# Patient Record
Sex: Female | Born: 1975 | Race: Black or African American | Hispanic: No | Marital: Single | Smoking: Former smoker
Health system: Southern US, Community
[De-identification: ages and names within clinical notes are randomized; demographics above are authoritative.]

## PROBLEM LIST (undated history)

## (undated) DIAGNOSIS — N946 Dysmenorrhea, unspecified: Secondary | ICD-10-CM

## (undated) DIAGNOSIS — IMO0002 Reserved for concepts with insufficient information to code with codable children: Secondary | ICD-10-CM

## (undated) DIAGNOSIS — F418 Other specified anxiety disorders: Secondary | ICD-10-CM

## (undated) DIAGNOSIS — D649 Anemia, unspecified: Secondary | ICD-10-CM

## (undated) DIAGNOSIS — G43009 Migraine without aura, not intractable, without status migrainosus: Secondary | ICD-10-CM

## (undated) DIAGNOSIS — D500 Iron deficiency anemia secondary to blood loss (chronic): Secondary | ICD-10-CM

## (undated) DIAGNOSIS — N920 Excessive and frequent menstruation with regular cycle: Secondary | ICD-10-CM

## (undated) HISTORY — DX: Dysmenorrhea, unspecified: N94.6

## (undated) HISTORY — DX: Other specified anxiety disorders: F41.8

## (undated) HISTORY — DX: Excessive and frequent menstruation with regular cycle: N92.0

## (undated) HISTORY — DX: Iron deficiency anemia secondary to blood loss (chronic): D50.0

## (undated) HISTORY — DX: Migraine without aura, not intractable, without status migrainosus: G43.009

## (undated) HISTORY — DX: Reserved for concepts with insufficient information to code with codable children: IMO0002

---

## 2014-10-29 DIAGNOSIS — Z01419 Encounter for gynecological examination (general) (routine) without abnormal findings: Secondary | ICD-10-CM | POA: Diagnosis not present

## 2014-11-01 DIAGNOSIS — Z0000 Encounter for general adult medical examination without abnormal findings: Secondary | ICD-10-CM | POA: Diagnosis not present

## 2014-11-05 DIAGNOSIS — N852 Hypertrophy of uterus: Secondary | ICD-10-CM | POA: Diagnosis not present

## 2014-11-05 DIAGNOSIS — N830 Follicular cyst of ovary: Secondary | ICD-10-CM | POA: Diagnosis not present

## 2014-11-05 DIAGNOSIS — R102 Pelvic and perineal pain: Secondary | ICD-10-CM | POA: Diagnosis not present

## 2014-11-26 DIAGNOSIS — Z7189 Other specified counseling: Secondary | ICD-10-CM | POA: Diagnosis not present

## 2015-01-14 DIAGNOSIS — L03114 Cellulitis of left upper limb: Secondary | ICD-10-CM | POA: Diagnosis not present

## 2015-01-14 DIAGNOSIS — S50812A Abrasion of left forearm, initial encounter: Secondary | ICD-10-CM | POA: Diagnosis not present

## 2015-03-05 DIAGNOSIS — L853 Xerosis cutis: Secondary | ICD-10-CM | POA: Diagnosis not present

## 2015-03-05 DIAGNOSIS — L700 Acne vulgaris: Secondary | ICD-10-CM | POA: Diagnosis not present

## 2015-03-05 DIAGNOSIS — R202 Paresthesia of skin: Secondary | ICD-10-CM | POA: Diagnosis not present

## 2015-03-05 DIAGNOSIS — L819 Disorder of pigmentation, unspecified: Secondary | ICD-10-CM | POA: Diagnosis not present

## 2015-04-10 DIAGNOSIS — Z0000 Encounter for general adult medical examination without abnormal findings: Secondary | ICD-10-CM | POA: Diagnosis not present

## 2015-04-10 DIAGNOSIS — G43709 Chronic migraine without aura, not intractable, without status migrainosus: Secondary | ICD-10-CM | POA: Diagnosis not present

## 2015-04-10 DIAGNOSIS — D649 Anemia, unspecified: Secondary | ICD-10-CM | POA: Diagnosis not present

## 2015-05-13 DIAGNOSIS — L981 Factitial dermatitis: Secondary | ICD-10-CM | POA: Diagnosis not present

## 2015-05-13 DIAGNOSIS — R202 Paresthesia of skin: Secondary | ICD-10-CM | POA: Diagnosis not present

## 2015-05-13 DIAGNOSIS — L723 Sebaceous cyst: Secondary | ICD-10-CM | POA: Diagnosis not present

## 2015-05-13 DIAGNOSIS — L819 Disorder of pigmentation, unspecified: Secondary | ICD-10-CM | POA: Diagnosis not present

## 2015-05-13 DIAGNOSIS — L700 Acne vulgaris: Secondary | ICD-10-CM | POA: Diagnosis not present

## 2015-06-19 DIAGNOSIS — Z23 Encounter for immunization: Secondary | ICD-10-CM | POA: Diagnosis not present

## 2015-06-20 DIAGNOSIS — L818 Other specified disorders of pigmentation: Secondary | ICD-10-CM | POA: Diagnosis not present

## 2015-06-20 DIAGNOSIS — L723 Sebaceous cyst: Secondary | ICD-10-CM | POA: Diagnosis not present

## 2015-06-20 DIAGNOSIS — L981 Factitial dermatitis: Secondary | ICD-10-CM | POA: Diagnosis not present

## 2015-06-20 DIAGNOSIS — R202 Paresthesia of skin: Secondary | ICD-10-CM | POA: Diagnosis not present

## 2015-06-20 DIAGNOSIS — L700 Acne vulgaris: Secondary | ICD-10-CM | POA: Diagnosis not present

## 2015-08-16 DIAGNOSIS — R531 Weakness: Secondary | ICD-10-CM | POA: Diagnosis not present

## 2015-08-16 DIAGNOSIS — R42 Dizziness and giddiness: Secondary | ICD-10-CM | POA: Diagnosis not present

## 2015-08-16 DIAGNOSIS — R21 Rash and other nonspecific skin eruption: Secondary | ICD-10-CM | POA: Diagnosis not present

## 2015-08-16 DIAGNOSIS — Z1389 Encounter for screening for other disorder: Secondary | ICD-10-CM | POA: Diagnosis not present

## 2015-08-26 DIAGNOSIS — F329 Major depressive disorder, single episode, unspecified: Secondary | ICD-10-CM | POA: Diagnosis not present

## 2015-08-28 DIAGNOSIS — Z0000 Encounter for general adult medical examination without abnormal findings: Secondary | ICD-10-CM | POA: Diagnosis not present

## 2015-08-28 DIAGNOSIS — R42 Dizziness and giddiness: Secondary | ICD-10-CM | POA: Diagnosis not present

## 2015-08-28 DIAGNOSIS — G43709 Chronic migraine without aura, not intractable, without status migrainosus: Secondary | ICD-10-CM | POA: Diagnosis not present

## 2015-08-28 DIAGNOSIS — D649 Anemia, unspecified: Secondary | ICD-10-CM | POA: Diagnosis not present

## 2015-08-29 DIAGNOSIS — D649 Anemia, unspecified: Secondary | ICD-10-CM | POA: Diagnosis not present

## 2015-08-29 DIAGNOSIS — F329 Major depressive disorder, single episode, unspecified: Secondary | ICD-10-CM | POA: Diagnosis not present

## 2015-08-29 DIAGNOSIS — Z0000 Encounter for general adult medical examination without abnormal findings: Secondary | ICD-10-CM | POA: Diagnosis not present

## 2015-08-29 DIAGNOSIS — R21 Rash and other nonspecific skin eruption: Secondary | ICD-10-CM | POA: Diagnosis not present

## 2015-08-30 DIAGNOSIS — F329 Major depressive disorder, single episode, unspecified: Secondary | ICD-10-CM | POA: Diagnosis not present

## 2015-08-30 DIAGNOSIS — R42 Dizziness and giddiness: Secondary | ICD-10-CM | POA: Diagnosis not present

## 2015-09-02 DIAGNOSIS — N946 Dysmenorrhea, unspecified: Secondary | ICD-10-CM | POA: Diagnosis not present

## 2015-09-02 DIAGNOSIS — N920 Excessive and frequent menstruation with regular cycle: Secondary | ICD-10-CM | POA: Diagnosis not present

## 2015-09-02 DIAGNOSIS — D649 Anemia, unspecified: Secondary | ICD-10-CM | POA: Diagnosis not present

## 2015-10-01 DIAGNOSIS — L700 Acne vulgaris: Secondary | ICD-10-CM | POA: Diagnosis not present

## 2015-10-01 DIAGNOSIS — N920 Excessive and frequent menstruation with regular cycle: Secondary | ICD-10-CM | POA: Diagnosis not present

## 2015-10-01 DIAGNOSIS — D509 Iron deficiency anemia, unspecified: Secondary | ICD-10-CM | POA: Diagnosis not present

## 2015-10-18 DIAGNOSIS — F329 Major depressive disorder, single episode, unspecified: Secondary | ICD-10-CM | POA: Diagnosis not present

## 2015-11-05 DIAGNOSIS — F3131 Bipolar disorder, current episode depressed, mild: Secondary | ICD-10-CM | POA: Diagnosis not present

## 2015-12-02 DIAGNOSIS — L700 Acne vulgaris: Secondary | ICD-10-CM | POA: Diagnosis not present

## 2015-12-02 DIAGNOSIS — D509 Iron deficiency anemia, unspecified: Secondary | ICD-10-CM | POA: Diagnosis not present

## 2015-12-02 DIAGNOSIS — F419 Anxiety disorder, unspecified: Secondary | ICD-10-CM | POA: Diagnosis not present

## 2015-12-02 DIAGNOSIS — F410 Panic disorder [episodic paroxysmal anxiety] without agoraphobia: Secondary | ICD-10-CM | POA: Diagnosis not present

## 2015-12-04 DIAGNOSIS — F419 Anxiety disorder, unspecified: Secondary | ICD-10-CM | POA: Diagnosis not present

## 2015-12-05 DIAGNOSIS — D509 Iron deficiency anemia, unspecified: Secondary | ICD-10-CM | POA: Diagnosis not present

## 2015-12-06 DIAGNOSIS — D509 Iron deficiency anemia, unspecified: Secondary | ICD-10-CM | POA: Diagnosis not present

## 2015-12-06 DIAGNOSIS — Z0271 Encounter for disability determination: Secondary | ICD-10-CM | POA: Diagnosis not present

## 2015-12-06 DIAGNOSIS — D649 Anemia, unspecified: Secondary | ICD-10-CM | POA: Diagnosis not present

## 2015-12-09 DIAGNOSIS — D509 Iron deficiency anemia, unspecified: Secondary | ICD-10-CM | POA: Diagnosis not present

## 2015-12-16 DIAGNOSIS — D509 Iron deficiency anemia, unspecified: Secondary | ICD-10-CM | POA: Diagnosis not present

## 2015-12-18 DIAGNOSIS — D509 Iron deficiency anemia, unspecified: Secondary | ICD-10-CM | POA: Diagnosis not present

## 2015-12-23 DIAGNOSIS — D509 Iron deficiency anemia, unspecified: Secondary | ICD-10-CM | POA: Diagnosis not present

## 2015-12-25 DIAGNOSIS — D509 Iron deficiency anemia, unspecified: Secondary | ICD-10-CM | POA: Diagnosis not present

## 2015-12-27 DIAGNOSIS — D509 Iron deficiency anemia, unspecified: Secondary | ICD-10-CM | POA: Diagnosis not present

## 2015-12-30 DIAGNOSIS — D509 Iron deficiency anemia, unspecified: Secondary | ICD-10-CM | POA: Diagnosis not present

## 2016-01-01 DIAGNOSIS — D509 Iron deficiency anemia, unspecified: Secondary | ICD-10-CM | POA: Diagnosis not present

## 2016-01-02 DIAGNOSIS — F3131 Bipolar disorder, current episode depressed, mild: Secondary | ICD-10-CM | POA: Diagnosis not present

## 2016-01-03 DIAGNOSIS — Z566 Other physical and mental strain related to work: Secondary | ICD-10-CM | POA: Diagnosis not present

## 2016-01-03 DIAGNOSIS — D509 Iron deficiency anemia, unspecified: Secondary | ICD-10-CM | POA: Diagnosis not present

## 2016-01-29 DIAGNOSIS — Z862 Personal history of diseases of the blood and blood-forming organs and certain disorders involving the immune mechanism: Secondary | ICD-10-CM | POA: Diagnosis not present

## 2016-02-21 DIAGNOSIS — F3131 Bipolar disorder, current episode depressed, mild: Secondary | ICD-10-CM | POA: Diagnosis not present

## 2016-02-27 DIAGNOSIS — Z8739 Personal history of other diseases of the musculoskeletal system and connective tissue: Secondary | ICD-10-CM | POA: Diagnosis not present

## 2016-03-26 DIAGNOSIS — F3131 Bipolar disorder, current episode depressed, mild: Secondary | ICD-10-CM | POA: Diagnosis not present

## 2016-07-07 DIAGNOSIS — Z23 Encounter for immunization: Secondary | ICD-10-CM | POA: Diagnosis not present

## 2016-09-07 ENCOUNTER — Ambulatory Visit (INDEPENDENT_AMBULATORY_CARE_PROVIDER_SITE_OTHER): Payer: BLUE CROSS/BLUE SHIELD | Admitting: Family Medicine

## 2016-09-07 VITALS — BP 112/70 | HR 71 | Temp 98.4°F | Resp 17 | Ht 68.0 in | Wt 173.0 lb

## 2016-09-07 DIAGNOSIS — D500 Iron deficiency anemia secondary to blood loss (chronic): Secondary | ICD-10-CM | POA: Diagnosis not present

## 2016-09-07 DIAGNOSIS — R55 Syncope and collapse: Secondary | ICD-10-CM | POA: Diagnosis not present

## 2016-09-07 DIAGNOSIS — N920 Excessive and frequent menstruation with regular cycle: Secondary | ICD-10-CM

## 2016-09-07 DIAGNOSIS — L910 Hypertrophic scar: Secondary | ICD-10-CM

## 2016-09-07 DIAGNOSIS — R42 Dizziness and giddiness: Secondary | ICD-10-CM

## 2016-09-07 LAB — POCT URINALYSIS DIP (MANUAL ENTRY)
BILIRUBIN UA: NEGATIVE
Bilirubin, UA: NEGATIVE
GLUCOSE UA: NEGATIVE
Nitrite, UA: POSITIVE — AB
Protein Ur, POC: NEGATIVE
RBC UA: NEGATIVE
SPEC GRAV UA: 1.02
UROBILINOGEN UA: 1
pH, UA: 7

## 2016-09-07 LAB — POCT CBC
GRANULOCYTE PERCENT: 48 % (ref 37–80)
HCT, POC: 28.1 % — AB (ref 37.7–47.9)
Hemoglobin: 9.3 g/dL — AB (ref 12.2–16.2)
Lymph, poc: 2.1 (ref 0.6–3.4)
MCH: 22.7 pg — AB (ref 27–31.2)
MCHC: 33.1 g/dL (ref 31.8–35.4)
MCV: 68.6 fL — AB (ref 80–97)
MID (CBC): 0.5 (ref 0–0.9)
MPV: 7.9 fL (ref 0–99.8)
PLATELET COUNT, POC: 309 10*3/uL (ref 142–424)
POC GRANULOCYTE: 2.4 (ref 2–6.9)
POC LYMPH %: 42.8 % (ref 10–50)
POC MID %: 9.2 %M (ref 0–12)
RBC: 4.1 M/uL (ref 4.04–5.48)
RDW, POC: 16.9 %
WBC: 5 10*3/uL (ref 4.6–10.2)

## 2016-09-07 MED ORDER — FERROUS SULFATE DRIED ER 160 (50 FE) MG PO TBCR: 1.0000 | EXTENDED_RELEASE_TABLET | Freq: Two times a day (BID) | ORAL | 0 refills | Status: DC

## 2016-09-07 MED ORDER — CIPROFLOXACIN HCL 250 MG PO TABS: 250.0000 mg | ORAL_TABLET | Freq: Two times a day (BID) | ORAL | 0 refills | Status: DC

## 2016-09-07 NOTE — Patient Instructions (Addendum)
IF you received an x-ray today, you will receive an invoice from [facility]. Please contact [facility] at [phone] with questions or concerns regarding your invoice.   IF you received labwork today, you will receive an invoice from [employer]. Please contact [facility] at [phone] with questions or concerns regarding your invoice.   Our billing staff will not be able to assist you with questions regarding bills from these companies.  You will be contacted with the lab results as soon as they are available. The fastest way to get your results is to activate your My Chart account. Instructions are located on the last page of this paperwork. If you have not heard from [place] regarding the results in 2 weeks, please contact this office.      Iron Deficiency Anemia, Adult Iron-deficiency anemia is when you have a low amount of red blood cells or hemoglobin. This happens because you have too little iron in your body. Hemoglobin carries oxygen to parts of the body. Anemia can cause your body to not get enough oxygen. It may or may not cause symptoms. Follow these instructions at home: Medicines  Take over-the-counter and prescription medicines only as told by your doctor. This includes iron pills (supplements) and vitamins.  If you cannot handle taking iron pills by mouth, ask your doctor about getting iron through:  A vein (intravenously).  A shot (injection) into a muscle.  Take iron pills when your stomach is empty. If you cannot handle this, take them with food.  Do not drink milk or take antacids at the same time as your iron pills.  To prevent trouble pooping (constipation), eat fiber or take medicine (stool softener) as told by your doctor. Eating and drinking  Talk with your doctor before changing the foods you eat. He or she may tell you to eat foods that have a lot of iron, such as:  Liver.  Lowfat (lean) beef.  Breads  and cereals that have iron added to them (fortified breads and cereals).  Eggs.  Dried fruit.  Dark green, leafy vegetables.  Drink enough fluid to keep your pee (urine) clear or pale yellow.  Eat fresh fruits and vegetables that are high in vitamin C. They help your body to use iron. Foods with a lot of vitamin C include:  Oranges.  Peppers.  Tomatoes.  Mangoes. General instructions  Return to your normal activities as told by your doctor. Ask your doctor what activities are safe for you.  Keep yourself clean, and keep things clean around you (your surroundings). Anemia can make you get sick more easily.  Keep all follow-up visits as told by your doctor. This is important. Contact a doctor if:  You feel sick to your stomach (nauseous).  You throw up (vomit).  You feel weak.  You are sweating for no clear reason.  You have trouble pooping, such as:  Pooping (having a bowel movement) less than 3 times a week.  Straining to poop.  Having poop that is hard, dry, or larger than normal.  Feeling full or bloated.  Pain in the lower belly.  Not feeling better after pooping. Get help right away if:  You pass out (faint). If this happens, do not drive yourself to the hospital. Call your local emergency services (911 in the [place]  You have chest pain.  You have shortness of breath that:  Is very bad.  Gets worse with physical activity.  You have a fast  heartbeat.  You get light-headed when getting up from sitting or lying down. This information is not intended to replace advice given to you by your health care provider. Make sure you discuss any questions you have with your health care provider. Document Released: 10/17/2010 Document Revised: 06/03/2016 Document Reviewed: 06/03/2016 Elsevier Interactive Patient Education  2017 Elsevier Inc.  Menorrhagia Menorrhagia is the medical term for when your menstrual periods are heavy or last longer than usual.  With menorrhagia, every period you have may cause enough blood loss and cramping that you are unable to maintain your usual activities. CAUSES  In some cases, the cause of heavy periods is unknown, but a number of conditions may cause menorrhagia. Common causes include:  A problem with the hormone-producing thyroid gland (hypothyroid).  Noncancerous growths in the uterus (polyps or fibroids).  An imbalance of the estrogen and progesterone hormones.  One of your ovaries not releasing an egg during one or more months.  Side effects of having an intrauterine device (IUD).  Side effects of some medicines, such as anti-inflammatory medicines or blood thinners.  A bleeding disorder that stops your blood from clotting normally. SIGNS AND SYMPTOMS  During a normal period, bleeding lasts between 4 and 8 days. Signs that your periods are too heavy include:  You routinely have to change your pad or tampon every 1 or 2 hours because it is completely soaked.  You pass blood clots larger than 1 inch (2.5 cm) in size.  You have bleeding for more than 7 days.  You need to use pads and tampons at the same time because of heavy bleeding.  You need to wake up to change your pads or tampons during the night.  You have symptoms of anemia, such as tiredness, fatigue, or shortness of breath. DIAGNOSIS  Your health care provider will perform a physical exam and ask you questions about your symptoms and menstrual history. Other tests may be ordered based on what the health care provider finds during the exam. These tests can include:  Blood tests. Blood tests are used to check if you are pregnant or have hormonal changes, a bleeding or thyroid disorder, low iron levels (anemia), or other problems.  Endometrial biopsy. Your health care provider takes a sample of tissue from the inside of your uterus to be examined under a microscope.  Pelvic ultrasound. This test uses sound waves to make a picture of  your uterus, ovaries, and vagina. The pictures can show if you have fibroids or other growths.  Hysteroscopy. For this test, your health care provider will use a small telescope to look inside your uterus. Based on the results of your initial tests, your health care provider may recommend further testing. TREATMENT  Treatment may not be needed. If it is needed, your health care provider may recommend treatment with one or more medicines first. If these do not reduce bleeding enough, a surgical treatment might be an option. The best treatment for you will depend on:   Whether you need to prevent pregnancy.  Your desire to have children in the future.  The cause and severity of your bleeding.  Your opinion and personal preference.  Medicines for menorrhagia may include:  Birth control methods that use hormones. These include the pill, skin patch, vaginal ring, shots that you get every 3 months, hormonal IUD, and implant. These treatments reduce bleeding during your menstrual period.  Medicines that thicken blood and slow bleeding.  Medicines that reduce swelling, such as ibuprofen.  Medicines that contain a synthetic hormone called progestin.   Medicines that make the ovaries stop working for a short time.  You may need surgical treatment for menorrhagia if the medicines are unsuccessful. Treatment options include:  Dilation and curettage (D&C). In this procedure, your health care provider opens (dilates) your cervix and then scrapes or suctions tissue from the lining of your uterus to reduce menstrual bleeding.  Operative hysteroscopy. This procedure uses a tiny tube with a light (hysteroscope) to view your uterine cavity and can help in the surgical removal of a polyp that may be causing heavy periods.  Endometrial ablation. Through various techniques, your health care provider permanently destroys the entire lining of your uterus (endometrium). After endometrial ablation, most  women have little or no menstrual flow. Endometrial ablation reduces your ability to become pregnant.  Endometrial resection. This surgical procedure uses an electrosurgical wire loop to remove the lining of the uterus. This procedure also reduces your ability to become pregnant.  Hysterectomy. Surgical removal of the uterus and cervix is a permanent procedure that stops menstrual periods. Pregnancy is not possible after a hysterectomy. This procedure requires anesthesia and hospitalization. HOME CARE INSTRUCTIONS   Only take over-the-counter or prescription medicines as directed by your health care provider. Take prescribed medicines exactly as directed. Do not change or switch medicines without consulting your health care provider.  Take any prescribed iron pills exactly as directed by your health care provider. Long-term heavy bleeding may result in low iron levels. Iron pills help replace the iron your body lost from heavy bleeding. Iron may cause constipation. If this becomes a problem, increase the bran, fruits, and roughage in your diet.  Do not take aspirin or medicines that contain aspirin 1 week before or during your menstrual period. Aspirin may make the bleeding worse.  If you need to change your sanitary pad or tampon more than once every 2 hours, stay in bed and rest as much as possible until the bleeding stops.  Eat well-balanced meals. Eat foods high in iron. Examples are leafy green vegetables, meat, liver, eggs, and whole grain breads and cereals. Do not try to lose weight until the abnormal bleeding has stopped and your blood iron level is back to normal. SEEK MEDICAL CARE IF:   You soak through a pad or tampon every 1 or 2 hours, and this happens every time you have a period.  You need to use pads and tampons at the same time because you are bleeding so much.  You need to change your pad or tampon during the night.  You have a period that lasts for more than 8 days.  You  pass clots bigger than 1 inch wide.  You have irregular periods that happen more or less often than once a month.  You feel dizzy or faint.  You feel very weak or tired.  You feel short of breath or feel your heart is beating too fast when you exercise.  You have nausea and vomiting or diarrhea while you are taking your medicine.  You have any problems that may be related to the medicine you are taking. SEEK IMMEDIATE MEDICAL CARE IF:   You soak through 4 or more pads or tampons in 2 hours.  You have any bleeding while you are pregnant. MAKE SURE YOU:   Understand these instructions.  Will watch your condition.  Will get help right away if you are not doing well or get worse. This information is not intended  to replace advice given to you by your health care provider. Make sure you discuss any questions you have with your health care provider. Document Released: 09/14/2005 Document Revised: 01/06/2016 Document Reviewed: 03/05/2013 Elsevier Interactive Patient Education  2017 [employer]

## 2016-09-07 NOTE — Progress Notes (Signed)
Chief Complaint  Patient presents with  . Dizziness    Since dec 2nd    HPI   Dizziness and Near- Syncope On December 2nd the patient started  Pt reports that she has been having dizziness and near-syncope.  She reports that she had to lay down at the job.  She denies nausea, vomiting or diarrhea She went occupation medication [place] She reports that she had a normal blood pressure She return to work after eating and drinking water She returned to her station and started feeling dizzy She stopped her iron supplement June 2017  Menorrhagia She reports that she has very heavy periods Patient's last menstrual period was 08/25/2016. Her periods last 5 - 7 days.  She reports that the first 3 days she has very heavy menses with cramp and passing clots.  She states that she is changing a pad every hour.  Dermatology- skin lesion She reports that she was [place] in [place]  She states that she had to get steroid injections in her face She reports that the lesions burn and itch She states that she has developed a new lesion in the past she gets large scarring  Initially the lesion starts like a pimple and seems to not heal The pimple initially drains blood and crusts over   No past medical history on file.  Current Outpatient Prescriptions  Medication Sig Dispense Refill  . ferrous sulfate (SLOW IRON) 160 (50 Fe) MG TBCR SR tablet Take 1 tablet (160 mg total) by mouth 2 (two) times daily. 30 each 0   No current facility-administered medications for this visit.     Allergies:  Allergies  Allergen Reactions  . Ciprofloxacin   . Fish Allergy   . Peanut-Containing Drug Products   . Strawberry (Diagnostic)   . Tylenol [Acetaminophen]     No past surgical history on file.  Social History   Social History  . Marital status: Single    Spouse name: N/A  . Number of children: N/A  . Years of education: N/A   Social History Main Topics  . Smoking status:  Current Some Day Smoker  . Smokeless tobacco: None  . Alcohol use None  . Drug use: Unknown  . Sexual activity: Not Asked   Other Topics Concern  . None   Social History Narrative  . None    Review of Systems  Constitutional: Negative for chills, fever and weight loss.  Eyes: Negative for blurred vision and double vision.  Cardiovascular: Negative for chest pain.  Gastrointestinal: Negative for nausea and vomiting.  Skin: Negative for itching and rash.  Neurological: Positive for dizziness. Negative for tingling and headaches.  Psychiatric/Behavioral: Positive for memory loss. The patient is nervous/anxious and has insomnia.     Objective: Vitals:   09/07/16 1622  BP: 112/70  Pulse: 71  Resp: 17  Temp: 98.4 F (36.9 C)  TempSrc: Oral  SpO2: 100%  Weight: 173 lb (78.5 kg)  Height: 5\' 8"  (1.727 m)  Body mass index is 26.3 kg/m.   Physical Exam  Constitutional: She is oriented to person, place, and time. She appears well-developed and well-nourished.  HENT:  Head: Normocephalic and atraumatic.  Right Ear: External ear normal.  Left Ear: External ear normal.  Nose: Nose normal.  Mouth/Throat: Oropharynx is clear and moist.  Eyes: Conjunctivae, EOM and lids are normal.  Neck: Normal range of motion. Neck supple.  Cardiovascular: Normal rate, regular rhythm, normal heart sounds and intact distal pulses.  No murmur heard. Pulmonary/Chest: Effort normal and breath sounds normal. No respiratory distress. She has no wheezes.  Musculoskeletal: Normal range of motion. She exhibits no edema.  Neurological: She is alert and oriented to person, place, and time. No cranial nerve deficit.  Skin: Skin is warm. Capillary refill takes less than 2 seconds. There is pallor.    Assessment and Plan Annali was seen today for dizziness.  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 -     Discontinue: ciprofloxacin (CIPRO) 250 MG tablet; Take 1 tablet (250 mg total) by mouth 2 (two) times daily.     Markice Torbert A Morrison Masser

## 2016-09-08 ENCOUNTER — Encounter: Payer: Self-pay | Admitting: Family Medicine

## 2016-09-08 LAB — COMPREHENSIVE METABOLIC PANEL
A/G RATIO: 1.3 (ref 1.2–2.2)
ALK PHOS: 98 IU/L (ref 39–117)
ALT: 14 IU/L (ref 0–32)
AST: 20 IU/L (ref 0–40)
Albumin: 4.3 g/dL (ref 3.5–5.5)
BUN/Creatinine Ratio: 14 (ref 9–23)
BUN: 10 mg/dL (ref 6–20)
Bilirubin Total: <0.2 mg/dL (ref 0.0–1.2)
CALCIUM: 9.1 mg/dL (ref 8.7–10.2)
CO2: 24 mmol/L (ref 18–29)
Chloride: 103 mmol/L (ref 96–106)
Creatinine, Ser: 0.74 mg/dL (ref 0.57–1.00)
GFR calc Af Amer: 119 mL/min/{1.73_m2} (ref >59)
GFR, EST NON AFRICAN AMERICAN: 103 mL/min/{1.73_m2} (ref >59)
GLOBULIN, TOTAL: 3.3 g/dL (ref 1.5–4.5)
Glucose: 77 mg/dL (ref 65–99)
POTASSIUM: 4.2 mmol/L (ref 3.5–5.2)
SODIUM: 142 mmol/L (ref 134–144)
Total Protein: 7.6 g/dL (ref 6.0–8.5)

## 2016-09-08 LAB — TSH: TSH: 1.13 u[IU]/mL (ref 0.450–4.500)

## 2016-09-09 ENCOUNTER — Telehealth: Payer: Self-pay

## 2016-09-09 ENCOUNTER — Telehealth: Payer: Self-pay | Admitting: Family Medicine

## 2016-09-09 MED ORDER — SULFAMETHOXAZOLE-TRIMETHOPRIM 800-160 MG PO TABS: 1.0000 | ORAL_TABLET | Freq: Two times a day (BID) | ORAL | 0 refills | Status: AC

## 2016-09-09 NOTE — Telephone Encounter (Signed)
Pt is calling about her antibiotic ciprofloxacin she is allergic to this medication and would like another antibiotic in place of this one   Please advise  [phone]

## 2016-09-09 NOTE — Telephone Encounter (Signed)
Updated allergy list and called in bactrim for 5 days and discontinued cipro

## 2016-09-09 NOTE — Telephone Encounter (Signed)
Dr. Creta LevinStallings Patient states, she is allergic to the antibiotic you prescribed 09/07/16 States, she did not take any at this time because of possible reaction.  No allergies to medication listed in chart She is allergic to Tylenol as well  Please advise

## 2016-09-10 ENCOUNTER — Telehealth: Payer: Self-pay | Admitting: Emergency Medicine

## 2016-09-14 ENCOUNTER — Ambulatory Visit: Payer: BLUE CROSS/BLUE SHIELD | Admitting: Obstetrics and Gynecology

## 2016-09-17 ENCOUNTER — Emergency Department (HOSPITAL_COMMUNITY)
Admission: EM | Admit: 2016-09-17 | Discharge: 2016-09-17 | Disposition: A | Discharge status: Home / Self Care | Payer: BLUE CROSS/BLUE SHIELD | Attending: Emergency Medicine | Admitting: Emergency Medicine

## 2016-09-17 ENCOUNTER — Encounter (HOSPITAL_COMMUNITY): Payer: Self-pay | Admitting: Nurse Practitioner

## 2016-09-17 ENCOUNTER — Ambulatory Visit: Payer: BLUE CROSS/BLUE SHIELD | Admitting: Obstetrics and Gynecology

## 2016-09-17 ENCOUNTER — Telehealth: Payer: Self-pay

## 2016-09-17 ENCOUNTER — Emergency Department (HOSPITAL_COMMUNITY): Payer: BLUE CROSS/BLUE SHIELD

## 2016-09-17 DIAGNOSIS — R509 Fever, unspecified: Secondary | ICD-10-CM

## 2016-09-17 DIAGNOSIS — Z91010 Allergy to peanuts: Secondary | ICD-10-CM | POA: Diagnosis not present

## 2016-09-17 DIAGNOSIS — B349 Viral infection, unspecified: Secondary | ICD-10-CM | POA: Diagnosis not present

## 2016-09-17 DIAGNOSIS — R05 Cough: Secondary | ICD-10-CM | POA: Diagnosis not present

## 2016-09-17 DIAGNOSIS — F17210 Nicotine dependence, cigarettes, uncomplicated: Secondary | ICD-10-CM | POA: Diagnosis not present

## 2016-09-17 DIAGNOSIS — T7840XS Allergy, unspecified, sequela: Secondary | ICD-10-CM

## 2016-09-17 HISTORY — DX: Anemia, unspecified: D64.9

## 2016-09-17 LAB — URINALYSIS, ROUTINE W REFLEX MICROSCOPIC
BILIRUBIN URINE: NEGATIVE
Bacteria, UA: NONE SEEN
Glucose, UA: NEGATIVE mg/dL
HGB URINE DIPSTICK: NEGATIVE
Ketones, ur: 5 mg/dL — AB
NITRITE: NEGATIVE
PH: 6 (ref 5.0–8.0)
Protein, ur: NEGATIVE mg/dL
SPECIFIC GRAVITY, URINE: 1.014 (ref 1.005–1.030)

## 2016-09-17 LAB — RAPID STREP SCREEN (MED CTR MEBANE ONLY): STREPTOCOCCUS, GROUP A SCREEN (DIRECT): NEGATIVE

## 2016-09-17 MED ORDER — IBUPROFEN 800 MG PO TABS
800.0000 mg | ORAL_TABLET | Freq: Once | ORAL | Status: AC
Start: 2016-09-17 — End: 2016-09-17
  Administered 2016-09-17: 800 mg via ORAL
  Filled 2016-09-17: qty 1

## 2016-09-17 NOTE — ED Provider Notes (Signed)
[facility]-EMERGENCY DEPT Provider Note   CSN: [id number] Arrival date & time: 09/17/16  1244   By signing my name below, I, Teofilo PodMatthew P. Jamison, attest that this documentation has been prepared under the direction and in the presence of Rolland PorterMark Karan Inclan, MD . Electronically Signed: Teofilo PodMatthew P. Jamison, ED Scribe. 09/17/2016. 3:23 PM.   History   Chief Complaint Chief Complaint  Patient presents with  . Medication Reaction    The history is provided by the patient. No language interpreter was used.   HPI Comments:  Leslie Gibson is a 40 y.o. female who presents to the Emergency Department, here due to adverse reaction to medication this AM. 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 Pt was on a leave of absence until she was seen by a doctor who prescribed cipro and bactrim, and told her that her iron levels were low. Yesterday pt took bactrim with iron at 0900. Pt began having a severe headache yesterday, and took motrin with moderate relief. Pt then took bactrim with iron at 2100 last night and then began feeling dizzy at 0300 this AM at work. Pt complains of associated fever, generalized body aches, cough, and diaphoresis and was seen at the nursing stating and had a fever of 101.7. Pt was seen and told that her iron levels were low. Pt reports that she had a fever of 101.   Past Medical History:  Diagnosis Date  . Anemia     There are no active problems to display for this patient.   Past Surgical History:  Procedure Laterality Date  . CESAREAN SECTION      OB History    No data available       Home Medications    Prior to Admission medications   Medication Sig Start Date End Date Taking? Authorizing Provider  ferrous sulfate (SLOW IRON) 160 (50 Fe) MG TBCR SR tablet Take 1 tablet (160 mg total) by mouth 2 (two) times daily. 09/07/16   Doristine BosworthZoe A Stallings, MD    Family History History reviewed. No pertinent family history.  Social  History Social History  Substance Use Topics  . Smoking status: Current Some Day Smoker  . Smokeless tobacco: Never Used  . Alcohol use No     Allergies   Ciprofloxacin; Fish allergy; Peanut-containing drug products; Strawberry (diagnostic); and Tylenol [acetaminophen]   Review of Systems Review of Systems  Constitutional: Positive for diaphoresis and fever. Negative for appetite change, chills and fatigue.  HENT: Negative for mouth sores, sore throat and trouble swallowing.   Eyes: Negative for visual disturbance.  Respiratory: Positive for cough. Negative for chest tightness, shortness of breath and wheezing.   Cardiovascular: Negative for chest pain.  Gastrointestinal: Negative for abdominal distention, abdominal pain, diarrhea, nausea and vomiting.  Endocrine: Negative for polydipsia, polyphagia and polyuria.  Genitourinary: Negative for dysuria, frequency and hematuria.  Musculoskeletal: Positive for myalgias. Negative for gait problem.  Skin: Negative for color change, pallor and rash.  Neurological: Positive for dizziness and headaches. Negative for syncope and light-headedness.  Hematological: Does not bruise/bleed easily.  Psychiatric/Behavioral: Negative for behavioral problems and confusion.  All other systems reviewed and are negative.    Physical Exam Updated Vital Signs BP 116/95   Pulse 97   Temp 101.8 F (38.8 C) (Oral)   Resp 17   LMP 08/25/2016   SpO2 100%   Physical Exam  Constitutional: She is oriented to person, place, and time.  She appears well-developed and well-nourished. No distress.  HENT:  Head: Normocephalic.  Eyes: Conjunctivae are normal. Pupils are equal, round, and reactive to light. No scleral icterus.  Neck: Normal range of motion. Neck supple. No thyromegaly present.  Cardiovascular: Normal rate and regular rhythm.  Exam reveals no gallop and no friction rub.   No murmur heard. Pulmonary/Chest: Effort normal and breath sounds  normal. No respiratory distress. She has no wheezes. She has no rales.  Abdominal: Soft. Bowel sounds are normal. She exhibits no distension. There is no tenderness. There is no rebound.  Musculoskeletal: Normal range of motion.  Neurological: She is alert and oriented to person, place, and time.  Skin: Skin is warm and dry. No rash noted.  Psychiatric: She has a normal mood and affect. Her behavior is normal.     ED Treatments / Results  DIAGNOSTIC STUDIES:  Oxygen Saturation is 100% on RA, normal by my interpretation.    COORDINATION OF CARE:  3:23 PM Discussed treatment plan with pt at bedside and pt agreed to plan.   Labs (all labs ordered are listed, but only abnormal results are displayed) Labs Reviewed  URINALYSIS, ROUTINE W REFLEX MICROSCOPIC - Abnormal; Notable for the following:       Result Value   Ketones, ur 5 (*)    Leukocytes, UA SMALL (*)    Squamous Epithelial / LPF 0-5 (*)    All other components within normal limits  RAPID STREP SCREEN (NOT AT [facility])  CULTURE, GROUP A STREP [facility])  URINE CULTURE    EKG  EKG Interpretation None       Radiology Dg Chest 2 View  Result Date: 09/17/2016 CLINICAL DATA:  Cough and fever EXAM: CHEST  2 VIEW COMPARISON:  None. FINDINGS: Lungs are clear. Heart size and pulmonary vascularity are normal. No adenopathy. No bone lesions. IMPRESSION: No edema or consolidation. Electronically Signed   By: Bretta BangWilliam  Woodruff III M.D.   On: 09/17/2016 13:57    Procedures Procedures (including critical care time)  Medications Ordered in ED Medications  ibuprofen (ADVIL,MOTRIN) tablet 800 mg (800 mg Oral Given 09/17/16 1327)     Initial Impression / Assessment and Plan / ED Course  I have reviewed the triage vital signs and the nursing notes.  Pertinent labs & imaging results that were available during my care of the patient were reviewed by me and considered in my medical decision making (see chart for details).  Clinical  Course     Urine with leukocytes. But no bacteria. Culture pending. Patient not currently symptomatic with UTI symptoms. Will not plan empiric treatment. Discussed with patient. She agrees. X-ray negative. Strep negative. She has been previously vaccinated for influenza. Likely viral syndrome. Plan rest, Motrin, fluids. Primary care follow-up. ER with changes.  Final Clinical Impressions(s) / ED Diagnoses   Final diagnoses:  Viral syndrome  Fever, unspecified fever cause    New Prescriptions New Prescriptions   No medications on file      Rolland PorterMark Jackquelyn Sundberg, MD 09/17/16 1523

## 2016-09-17 NOTE — ED Notes (Signed)
The pt was not medicated for fever in triage because she reports a tylenol allergy. [place] and staff made aware

## 2016-09-17 NOTE — ED Triage Notes (Signed)
Pt presents with c/o medication reaction.The reaction started early this morning. The reaction began after she took an iron pill and bactrim together. She reports chills, headache, feeling faint, and swelling in her throat and neck after taking the medications together. She denies sob or difficulty swallowing.

## 2016-09-17 NOTE — Telephone Encounter (Signed)
Pt is currently being seen and treated in the ER.

## 2016-09-17 NOTE — Discharge Instructions (Signed)
Ibuprofen/Motrin/Advil 400-837m by mouth every 6-*hours as needed for fever or body aches. (Your lst dose was at 1:00 today.

## 2016-09-17 NOTE — ED Notes (Signed)
Patient taking bactrim for a UTI

## 2016-09-17 NOTE — Telephone Encounter (Signed)
Pt is calling to let dr Creta Levinstallings know that the second antibodic she was prescribed she is allergic to as well   She has developed a fever throat is closing up and stiff neck -pt states she is on the way to hospital   But would like a call back from clinical since stallings is not her today  Best [phone]

## 2016-09-18 LAB — URINE CULTURE

## 2016-09-18 NOTE — Telephone Encounter (Signed)
I agree with her going to the Emergency Department.  Let her know that I am going to send her to get allergy testing because she might have more drug allergies that she is not aware of. Also she does not know her current reactions to medications like tylenol.  Please let her know that she will be contacted in a week for that appointment.

## 2016-09-18 NOTE — Telephone Encounter (Signed)
Pt is home and doing better as far as reaction. ED took her off of all meds except Motrin for discomfort. Pt agrees to allergy testing and will wait for referral call. She will bring by more ppw that she will need to extend her absence from work.

## 2016-09-19 LAB — CULTURE, GROUP A STREP (THRC)

## 2016-09-22 ENCOUNTER — Telehealth: Payer: Self-pay

## 2016-09-22 NOTE — Telephone Encounter (Signed)
Patient came in to drop off forms for Leslie Gibson to fill out and fax back to her employer in regards to her anaphylaxis.  Please advise  [phone]

## 2016-09-24 ENCOUNTER — Telehealth: Payer: Self-pay

## 2016-09-24 NOTE — Telephone Encounter (Signed)
Paperwork is for short term disability so I will need to get these forms place in FMLA/Disability box so that I can get them scanned into the computer, patient is being very demanding with this process she doesn't seem to understand our process of it taking 5-7 business days---patient will also have to pay $15 fee for getting these forms completed.   Just for future reference please make sure you check the forms to see what it is for because I need to get the copy of the blank forms plus the completed forms for their chart and they need to fill out the FMLA/Disability packet at the front.

## 2016-09-24 NOTE — Telephone Encounter (Signed)
called pt to make her aware of her appointment with [facility] on jan. 2 at 2:30pm

## 2016-09-25 NOTE — Telephone Encounter (Signed)
Called patient to let her know that Dr Creta LevinStallings or another provdier is going to have to see her before her forms can be completed for her short term disability. I could not leave a message because her voicemail is full. I will try again later today

## 2016-09-30 ENCOUNTER — Ambulatory Visit: Payer: BLUE CROSS/BLUE SHIELD | Admitting: Family Medicine

## 2016-09-30 ENCOUNTER — Ambulatory Visit (INDEPENDENT_AMBULATORY_CARE_PROVIDER_SITE_OTHER): Payer: BLUE CROSS/BLUE SHIELD | Admitting: Family Medicine

## 2016-09-30 VITALS — BP 112/64 | HR 70 | Temp 98.5°F | Ht 68.0 in | Wt 173.4 lb

## 2016-09-30 DIAGNOSIS — D62 Acute posthemorrhagic anemia: Secondary | ICD-10-CM | POA: Diagnosis not present

## 2016-09-30 DIAGNOSIS — I951 Orthostatic hypotension: Secondary | ICD-10-CM | POA: Diagnosis not present

## 2016-09-30 NOTE — Progress Notes (Signed)
Leslie Gibson is a 41 y.o. female who presents to [facility] today for completion of FMLA forms:  1.  FMLA:  See prior office visit and telephone notes for full details. In short, patient has long-standing history of anemia. She works third shift Production assistant, [employer]. The past several days to weeks she's been having increasing dizziness and lightheadedness when working.has had some hypertension as well.    note she had been seen here female December for UTI. Was prescribed Cipro and then Bactrim. She had allergic reaction to both of these. She was seen in the emergency department for this.  She's had no further symptoms since that time of UTI.  For her anemia and lightheadedness she is not been able to work since surgery first.She states that her manager at work told her she was a Psychiatric nurse"liability" because her the risk of her falling and hurting herself while at work. She has follow-up appointments with gynecology at the end of this month as well as multiple appointments with dermatology and allergy specialists during this month as well. She has been taking her iron regularly. She recently moved from [place] and now works [employer]. She lives in [place] and drives an hour and a half to and from [place] to work.  Seen gynecology and she has a history of heavy menstrual bleeding.. She has evidently been tried on multiple OCPs in the past and has had issues with what sounds to be most of these. She therefore very hesitant to take any further birth control without first seeing a gynecologist.  ROS as above.  Some nausea without overt vomiting when she becomes lightheaded at work. Headaches as well during that time. No chest pain or shortness of breath.  PMH reviewed. Patient is a nonsmoker.   Past Medical History:  Diagnosis Date  . Anemia    Past Surgical History:  Procedure Laterality Date  . CESAREAN SECTION      Medications reviewed. Current Outpatient Prescriptions    Medication Sig Dispense Refill  . ferrous sulfate (SLOW IRON) 160 (50 Fe) MG TBCR SR tablet Take 1 tablet (160 mg total) by mouth 2 (two) times daily. (Patient not taking: Reported on 09/30/2016) 30 each 0   No current facility-administered medications for this visit.      Physical Exam:  BP 112/64 (BP Location: Right Arm, Patient Position: Sitting, Cuff Size: Small)   Pulse 70   Temp 98.5 F (36.9 C) (Oral)   Ht 5\' 8"  (1.727 m)   Wt 173 lb 6.4 oz (78.7 kg)   LMP 09/24/2016 (Exact Date)   SpO2 98%   BMI 26.37 kg/m  Gen:  Alert, cooperative patient who appears stated age in no acute distress.  Vital signs reviewed. HEENT: EOMI,  MMM.  Pale conjunctiva noted.  Pulm:  Clear to auscultation bilaterally with good air movement.  No wheezes or rales noted.   Cardiac:  Regular rate and rhythm  Abd:  Soft/nondistended/nontender.   Exts: Non edematous BL  LE, warm and well perfused.  Psych: Initially spoke with rather pressured speech. This slowed down as we continued our discussion. Neuro: Alert and oriented x 4. No focal deficits noted. No nystagmus. Easily up and walking around the room during our conversation.  Assessment and Plan:  1.  Chronic anemia: - patient left prior to obtaining CBC.  TO return for this.   - Last CBC was 9.3 mid-December.  - FMLA forms completed for continued lightheadedness and orthostasis while at  work. Seems to be secondary to her chronic anemia. She needs to continue taking her iron. There is also an issue with her driving an hour and a half one way for work.  -Her gynecology appointment is generated 28th. I have written her until February 1 as we should have a plan at that point for what to do about her vaginal bleeding and resultant anemia.  #2. Orthostasis: -Seems be secondary to #1. -She is not on any blood pressure medicines. -Neurologically she seems intact. -As noted above we will have her come back to recheck her CBC.  No other symptoms indicating  intra-cranial process.  #3. UTI: -Resolved. -I have added Bactrim to her medication allergy list.

## 2016-10-02 ENCOUNTER — Other Ambulatory Visit: Payer: Self-pay | Admitting: Family Medicine

## 2016-10-02 DIAGNOSIS — D62 Acute posthemorrhagic anemia: Secondary | ICD-10-CM

## 2016-10-02 NOTE — Progress Notes (Signed)
Line busy. Will try to call again

## 2016-10-02 NOTE — Telephone Encounter (Signed)
Paperwork scanned and faxed to [employer] on 10/02/16

## 2016-10-02 NOTE — Progress Notes (Unsigned)
Can you call patient to have her return to check her Hgb for anemia?  We forgot to do this last visit as we spent the majority of time filling out her FMLA forms.  Thanks! JW

## 2016-10-06 ENCOUNTER — Other Ambulatory Visit: Payer: BLUE CROSS/BLUE SHIELD

## 2016-10-06 DIAGNOSIS — D62 Acute posthemorrhagic anemia: Secondary | ICD-10-CM

## 2016-10-06 NOTE — Progress Notes (Signed)
Pt advised and will come in today 10/06/16 for fast track lab, order is in

## 2016-10-07 ENCOUNTER — Ambulatory Visit: Payer: BLUE CROSS/BLUE SHIELD | Admitting: Obstetrics and Gynecology

## 2016-10-07 LAB — CBC WITH DIFFERENTIAL/PLATELET
BASOS: 1 %
Basophils Absolute: 0 10*3/uL (ref 0.0–0.2)
EOS (ABSOLUTE): 0.1 10*3/uL (ref 0.0–0.4)
Eos: 2 %
Hematocrit: 32 % — ABNORMAL LOW (ref 34.0–46.6)
Hemoglobin: 9.1 g/dL — ABNORMAL LOW (ref 11.1–15.9)
Immature Grans (Abs): 0 10*3/uL (ref 0.0–0.1)
Immature Granulocytes: 0 %
LYMPHS ABS: 1.5 10*3/uL (ref 0.7–3.1)
Lymphs: 33 %
MCH: 21.4 pg — AB (ref 26.6–33.0)
MCHC: 28.4 g/dL — ABNORMAL LOW (ref 31.5–35.7)
MCV: 75 fL — AB (ref 79–97)
MONOS ABS: 0.6 10*3/uL (ref 0.1–0.9)
Monocytes: 12 %
NEUTROS ABS: 2.3 10*3/uL (ref 1.4–7.0)
Neutrophils: 52 %
PLATELETS: 384 10*3/uL — AB (ref 150–379)
RBC: 4.25 x10E6/uL (ref 3.77–5.28)
RDW: 19.2 % — AB (ref 12.3–15.4)
WBC: 4.5 10*3/uL (ref 3.4–10.8)

## 2016-10-12 ENCOUNTER — Ambulatory Visit (INDEPENDENT_AMBULATORY_CARE_PROVIDER_SITE_OTHER): Payer: BLUE CROSS/BLUE SHIELD | Admitting: Obstetrics and Gynecology

## 2016-10-12 ENCOUNTER — Encounter: Payer: Self-pay | Admitting: Obstetrics and Gynecology

## 2016-10-12 VITALS — BP 122/66 | HR 64 | Resp 12 | Ht 67.5 in | Wt 173.0 lb

## 2016-10-12 DIAGNOSIS — Z124 Encounter for screening for malignant neoplasm of cervix: Secondary | ICD-10-CM

## 2016-10-12 DIAGNOSIS — N920 Excessive and frequent menstruation with regular cycle: Secondary | ICD-10-CM | POA: Diagnosis not present

## 2016-10-12 DIAGNOSIS — Z01419 Encounter for gynecological examination (general) (routine) without abnormal findings: Secondary | ICD-10-CM | POA: Diagnosis not present

## 2016-10-12 DIAGNOSIS — Z1151 Encounter for screening for human papillomavirus (HPV): Secondary | ICD-10-CM | POA: Diagnosis not present

## 2016-10-12 DIAGNOSIS — Z113 Encounter for screening for infections with a predominantly sexual mode of transmission: Secondary | ICD-10-CM | POA: Diagnosis not present

## 2016-10-12 DIAGNOSIS — D500 Iron deficiency anemia secondary to blood loss (chronic): Secondary | ICD-10-CM

## 2016-10-12 DIAGNOSIS — Z0000 Encounter for general adult medical examination without abnormal findings: Secondary | ICD-10-CM | POA: Diagnosis not present

## 2016-10-12 LAB — LIPID PANEL
Cholesterol: 134 mg/dL (ref <200)
HDL: 40 mg/dL — AB (ref >50)
LDL CALC: 61 mg/dL (ref <100)
Total CHOL/HDL Ratio: 3.4 Ratio (ref <5.0)
Triglycerides: 164 mg/dL — ABNORMAL HIGH (ref <150)
VLDL: 33 mg/dL — ABNORMAL HIGH (ref <30)

## 2016-10-12 LAB — HEPATITIS C ANTIBODY: HCV Ab: NEGATIVE

## 2016-10-12 LAB — FERRITIN: Ferritin: 7 ng/mL — ABNORMAL LOW (ref 10–154)
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
## 2016-10-12 NOTE — Patient Instructions (Signed)

## 2016-10-13 LAB — STD PANEL
HEP B S AG: NEGATIVE
HIV 1&2 Ab, 4th Generation: NONREACTIVE

## 2016-10-13 LAB — VITAMIN D 25 HYDROXY (VIT D DEFICIENCY, FRACTURES): VIT D 25 HYDROXY: 8 ng/mL — AB (ref 30–100)

## 2016-10-14 LAB — IPS N GONORRHOEA AND CHLAMYDIA BY PCR

## 2016-10-14 LAB — IPS PAP TEST WITH HPV

## 2016-10-20 ENCOUNTER — Telehealth: Payer: Self-pay | Admitting: Obstetrics and Gynecology

## 2016-10-20 ENCOUNTER — Other Ambulatory Visit: Payer: Self-pay | Admitting: *Deleted

## 2016-10-20 ENCOUNTER — Other Ambulatory Visit: Payer: BLUE CROSS/BLUE SHIELD | Admitting: Obstetrics and Gynecology

## 2016-10-20 ENCOUNTER — Other Ambulatory Visit: Payer: BLUE CROSS/BLUE SHIELD

## 2016-10-20 DIAGNOSIS — D500 Iron deficiency anemia secondary to blood loss (chronic): Secondary | ICD-10-CM

## 2016-10-20 DIAGNOSIS — N920 Excessive and frequent menstruation with regular cycle: Secondary | ICD-10-CM

## 2016-10-20 NOTE — Telephone Encounter (Signed)
Patient returned call and rescheduled appointment. Patient scheduled 10/27/16 with Dr Oscar LaJertson. Patient aware of date, arrival time and cancellation policy. No further questions. Ok to close

## 2016-10-20 NOTE — Telephone Encounter (Signed)
Called patient to reschedule recommended ultrasound. Left Voicemail requesting a call back.
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
## 2016-10-22 ENCOUNTER — Telehealth: Payer: Self-pay

## 2016-10-22 DIAGNOSIS — R7989 Other specified abnormal findings of blood chemistry: Secondary | ICD-10-CM

## 2016-10-22 MED ORDER — VITAMIN D (ERGOCALCIFEROL) 1.25 MG (50000 UNIT) PO CAPS: 50000.0000 [IU] | ORAL_CAPSULE | ORAL | 0 refills | Status: DC

## 2016-10-22 NOTE — Telephone Encounter (Signed)
Spoke with patient. Advised of results as seen below from Dr.Jertson. Patient verbalizes understanding. Patient states she is having allergy testing tomorrow and was advised not to take any medication for 3 days prior. Patient states she would like to speak with allergist tomorrow and will begin Vitamin D after reviewing recommendations from allergist. Advised rx for Vitamin D 50,000 IU take 1 tablet weekly #12 0RF has been sent to pharmacy on file with a note that the patient will call to fill. Patient is agreeable. Patient will call to give update on allergy testing results and have results faxed to the office as she states she has many allergies. Advised this will need to be updated in her chart. Patient is agreeable. 02 recall placed.  Routing to provider for final review. Patient agreeable to disposition. Will close encounter.

## 2016-10-22 NOTE — Telephone Encounter (Signed)
-----   Message from Romualdo BolkJill Evelyn Jertson, MD sent at 10/20/2016  5:37 PM EST ----- Please inform the patient that her vit d level is very low and start her on 50,000 IU of vit D 3 weekly x 12 weeks. Then she needs to return for another vit d level. Her iron stores are very low, this goes along with her anemia, she should keep taking her iron. Her lipid panel is slightly abnormal, this increases her risk of heart disease down the road. She should eat a diet low in saturated fats, low in carbohydrates and exercise regularly.  02 recall for pap The rest of her lab work was negative.  Thanks!!

## 2016-10-22 NOTE — Telephone Encounter (Signed)
Left message to call Jesalyn Finazzo at [phone]. 

## 2016-10-27 ENCOUNTER — Ambulatory Visit (INDEPENDENT_AMBULATORY_CARE_PROVIDER_SITE_OTHER): Payer: BLUE CROSS/BLUE SHIELD | Admitting: Obstetrics and Gynecology

## 2016-10-27 ENCOUNTER — Ambulatory Visit (INDEPENDENT_AMBULATORY_CARE_PROVIDER_SITE_OTHER): Payer: BLUE CROSS/BLUE SHIELD

## 2016-10-27 ENCOUNTER — Encounter: Payer: Self-pay | Admitting: Obstetrics and Gynecology

## 2016-10-27 VITALS — BP 110/60 | HR 64 | Resp 14 | Wt 173.0 lb

## 2016-10-27 DIAGNOSIS — N8003 Adenomyosis of the uterus: Secondary | ICD-10-CM

## 2016-10-27 DIAGNOSIS — D500 Iron deficiency anemia secondary to blood loss (chronic): Secondary | ICD-10-CM | POA: Diagnosis not present

## 2016-10-27 DIAGNOSIS — Z3009 Encounter for other general counseling and advice on contraception: Secondary | ICD-10-CM | POA: Diagnosis not present

## 2016-10-27 DIAGNOSIS — N920 Excessive and frequent menstruation with regular cycle: Secondary | ICD-10-CM | POA: Diagnosis not present

## 2016-10-27 DIAGNOSIS — G43009 Migraine without aura, not intractable, without status migrainosus: Secondary | ICD-10-CM | POA: Insufficient documentation

## 2016-10-27 DIAGNOSIS — N809 Endometriosis, unspecified: Secondary | ICD-10-CM

## 2016-10-27 DIAGNOSIS — F418 Other specified anxiety disorders: Secondary | ICD-10-CM | POA: Insufficient documentation

## 2016-10-27 DIAGNOSIS — N946 Dysmenorrhea, unspecified: Secondary | ICD-10-CM | POA: Diagnosis not present

## 2016-10-27 DIAGNOSIS — D649 Anemia, unspecified: Secondary | ICD-10-CM | POA: Insufficient documentation

## 2016-10-27 DIAGNOSIS — N800 Endometriosis of uterus: Secondary | ICD-10-CM | POA: Diagnosis not present
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
## 2016-10-27 NOTE — Patient Instructions (Signed)
Adenomyosis

## 2016-10-28 ENCOUNTER — Telehealth: Payer: Self-pay

## 2016-10-28 ENCOUNTER — Telehealth: Payer: Self-pay | Admitting: Obstetrics and Gynecology

## 2016-10-28 NOTE — Telephone Encounter (Signed)
I will see if Dr Gwendolyn GrantWalden will extend her time, I will print a blank FMLA form and place it in his box but with the patient not going to her allergy appointments I don't think we will be able to do this.    Dr Gwendolyn GrantWalden if you do not wish to extend this FMLA time please let me know and I will call the patient and talk to her. She has been out of work since Dr Creta LevinStallings wrote her out in Dec 2017.

## 2016-10-28 NOTE — Telephone Encounter (Signed)
Pt called asking for me to fax over OV notes to [facility]. Notes have just been sent.  Also wants Dr Gwendolyn GrantWalden to know that her gyno is having her receive iron transfusions (gyno is sending a note to our office) and she has not yet seen her 2nd allergy specialist at [facility]. Due to this, she would like an extension on her FMLA. States that the dates need to be specific when this is submitted.

## 2016-10-28 NOTE — Telephone Encounter (Signed)
Just spoke with [facility] who states that they have scheduled with the patient 3-4 times, all of which were cancellations or no shows. I asked her to please attempt one more time. She will let the pt know that this is their final attempt and should she cancel/no show, they will not schedule again.

## 2016-10-28 NOTE — Telephone Encounter (Signed)
Patient is calling to see if a referral to have a iron transfusion has been made yet.

## 2016-10-28 NOTE — Telephone Encounter (Signed)
Pt is still under specialist care and was scheduled to go back to work on 10/29/16 but she is not able to due to appointments for the iron transfusion  Pt is very anxious to get this letter extended due to her not going back tomorrow   Please call as soon as possible [phone]

## 2016-10-29 ENCOUNTER — Ambulatory Visit (INDEPENDENT_AMBULATORY_CARE_PROVIDER_SITE_OTHER): Payer: BLUE CROSS/BLUE SHIELD | Admitting: Family Medicine

## 2016-10-29 VITALS — BP 114/70 | HR 69 | Temp 98.8°F | Resp 18 | Ht 67.5 in | Wt 173.0 lb

## 2016-10-29 DIAGNOSIS — D509 Iron deficiency anemia, unspecified: Secondary | ICD-10-CM

## 2016-10-29 DIAGNOSIS — R769 Abnormal immunological finding in serum, unspecified: Secondary | ICD-10-CM

## 2016-10-29 DIAGNOSIS — K1379 Other lesions of oral mucosa: Secondary | ICD-10-CM

## 2016-10-29 DIAGNOSIS — R0609 Other forms of dyspnea: Secondary | ICD-10-CM

## 2016-10-29 NOTE — Patient Instructions (Addendum)
It was good to see you again today  Our FMLA team has your paperwork.  I will complete this once they are back in the office tomorrow.   I will refer you to a dermatologist.    I'm glad things are going in the right direction for you!     IF you received an x-ray today, you will receive an invoice from [facility]. Please contact [facility] at [phone] with questions or concerns regarding your invoice.   IF you received labwork today, you will receive an invoice from [facility]. Please contact [facility] at [phone] with questions or concerns regarding your invoice.   Our billing staff will not be able to assist you with questions regarding bills from these companies.  You will be contacted with the lab results as soon as they are available. The fastest way to get your results is to activate your My Chart account. Instructions are located on the last page of this paperwork. If you have not heard from us regarding the results in 2 weeks, please contact this office.
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
## 2016-10-29 NOTE — Telephone Encounter (Signed)
I think it's best not to extend her FMLA.  It really wasn't actually her allergy appointment I was worried about -- it was her fatigue from her anemia.  I was more worried about her appt with the Ob-GYN

## 2016-10-29 NOTE — Telephone Encounter (Signed)
Patient is calling to check the status of an appointment for iron infusion.

## 2016-10-29 NOTE — Telephone Encounter (Signed)
Spoke with patient. Patient states she was supposed to return to work today from [employer], but is unable to due to the need for upcoming appointments with allergist and hematologist for possible iron transfusion. Patient has an appointment today 10/29/2016 at 4:30 pm to discuss FMLA with PCP at [facility] who is managing FMLA. Requesting recent OV note from  10/27/2016 with Dr.Jertson be faxed to PCP for her appointment today. Verbal request for release of medical records completed [facility]. Advised patient referral has been placed by Dr.Jertson to Dr.Ennever for consult regarding possible iron transfusion. Dr.Ennever reviews all patient charts before scheduling an appointment to ensure proper appointment is scheduled. Advised she will be contacted directly by Dr.Ennever's office to schedule. Provided number to Dr.Ennever's office as [phone]. Patient is agreeable and verbalizes understanding.  Routing to provider for final review. Patient agreeable to disposition. Will close encounter.

## 2016-10-30 NOTE — Telephone Encounter (Signed)
I have completed her blank Leave of Absence forms and placed them in the nurses box up front.  This wasn't for allergies, it was for chronic fatigue/dizziness stemming from chronic iron deficiency.   She'll be returning at 2 pm today for daughter's appointment.  I told Clydie BraunKaren she can pick up the forms then.

## 2016-10-30 NOTE — Telephone Encounter (Signed)
Picked up.

## 2016-11-02 ENCOUNTER — Telehealth: Payer: Self-pay

## 2016-11-02 NOTE — Telephone Encounter (Signed)
Saw the note attached to this patients FMLA form stating they could not find the forms for her. They are scanned in under Media. I have printed a blank copy and placed them in Dr Tyson AliasWalden's box on 11/02/16. If you could please return them to the FMLA/Disability box at the 102 checkout desk within 5-7 business days. Thank you!

## 2016-11-04 NOTE — Telephone Encounter (Signed)
Leslie Gibson did we get a copy of these forms because I have to have them for her records [facility].

## 2016-11-05 DIAGNOSIS — J301 Allergic rhinitis due to pollen: Secondary | ICD-10-CM | POA: Diagnosis not present

## 2016-11-05 DIAGNOSIS — Z91018 Allergy to other foods: Secondary | ICD-10-CM | POA: Diagnosis not present

## 2016-11-05 DIAGNOSIS — T887XXA Unspecified adverse effect of drug or medicament, initial encounter: Secondary | ICD-10-CM | POA: Diagnosis not present

## 2016-11-05 DIAGNOSIS — D500 Iron deficiency anemia secondary to blood loss (chronic): Secondary | ICD-10-CM | POA: Diagnosis not present

## 2016-11-05 DIAGNOSIS — J302 Other seasonal allergic rhinitis: Secondary | ICD-10-CM | POA: Diagnosis not present

## 2016-11-05 NOTE — Telephone Encounter (Signed)
That is fine, I called the patient she is going to bring a copy by for me on 11/06/16. Thank you

## 2016-11-05 NOTE — Telephone Encounter (Signed)
Thanks!  I was actually able to complete a copy last Thursday -- I found it under the Media section.  We faxed it that night to the patient's fax number.

## 2016-11-05 NOTE — Telephone Encounter (Signed)
I donot know if anyone made a copy, they were just in the nurse box and I gave them to her at daughters appt.

## 2016-11-09 ENCOUNTER — Telehealth: Payer: Self-pay | Admitting: Obstetrics and Gynecology

## 2016-11-09 NOTE — Telephone Encounter (Signed)
Called patient to review benefits for IUD. Patient picked up call and stated she was in a meeting at her child's school. Stated she will call back.

## 2016-11-12 NOTE — Telephone Encounter (Signed)
Spoke with patient. Patient states she was referred to a nutritionist and has an appointment scheduled for today at 10:30am. Patient states she received a call last week from a lady from [facility] to notify her of appointment. Patient states she has no details of where she is supposed to go or who to contact. Patient states she did not write down any of the information from the call. Advised patient no orders placed for referral for nutritionist, did see allergist and dermatology, also had another triage nurse review. Asked patient if pcp may have referred, patient states "no", she called pcp. Advised patient would review with Dr. Oscar LaJertson and return call with any additional recommendations, patient is agreeable.  Dr. Oscar LaJertson -are you aware of  Nutritionist referral?

## 2016-11-12 NOTE — Telephone Encounter (Signed)
Unable to leave message,mailbox full. 

## 2016-11-12 NOTE — Telephone Encounter (Signed)
Patient called to get information for the nutritionist she's going to see today.

## 2016-11-12 NOTE — Telephone Encounter (Signed)
[facility] at [phone], [facility]. Was advised patient not scheduled at this location, is scheduled at [facility] to see Nutritionist today at 11am. The location is [address], [place], [place] [phone].   Call to patient, unable to leave voice mail, mailbox full.

## 2016-11-13 NOTE — Telephone Encounter (Addendum)
Call to patient, left detailed message, ok per current dpr. Advised as seen below regarding [facility]. Advised patient to return call to office at [phone] for any additional questions.  Routing to provider for final review. Patient is agreeable to disposition. Will close encounter.

## 2016-11-13 NOTE — Telephone Encounter (Signed)
Spoke with patient. Patient states she called the Nutritionist and is rescheduled to 11/16/16 at 10:30am. Patient states allergist was the one who placed referral. Patient thankful for return call and help.  Routing to provider for final review. Patient is agreeable to disposition. Will close encounter.

## 2016-11-19 ENCOUNTER — Ambulatory Visit: Payer: BLUE CROSS/BLUE SHIELD

## 2016-11-19 ENCOUNTER — Other Ambulatory Visit (HOSPITAL_BASED_OUTPATIENT_CLINIC_OR_DEPARTMENT_OTHER): Payer: BLUE CROSS/BLUE SHIELD

## 2016-11-19 ENCOUNTER — Other Ambulatory Visit: Payer: Self-pay | Admitting: Family

## 2016-11-19 ENCOUNTER — Encounter: Payer: Self-pay | Admitting: Family

## 2016-11-19 DIAGNOSIS — N920 Excessive and frequent menstruation with regular cycle: Secondary | ICD-10-CM

## 2016-11-19 DIAGNOSIS — D500 Iron deficiency anemia secondary to blood loss (chronic): Secondary | ICD-10-CM

## 2016-11-19 LAB — CBC WITH DIFFERENTIAL (CANCER CENTER ONLY)
BASO#: 0 10*3/uL (ref 0.0–0.2)
BASO%: 0.3 % (ref 0.0–2.0)
EOS ABS: 0.1 10*3/uL (ref 0.0–0.5)
EOS%: 1.6 % (ref 0.0–7.0)
HEMATOCRIT: 30.4 % — AB (ref 34.8–46.6)
HEMOGLOBIN: 9.3 g/dL — AB (ref 11.6–15.9)
LYMPH#: 1.1 10*3/uL (ref 0.9–3.3)
LYMPH%: 14.9 % (ref 14.0–48.0)
MCH: 22.1 pg — AB (ref 26.0–34.0)
MCHC: 30.6 g/dL — AB (ref 32.0–36.0)
MCV: 72 fL — AB (ref 81–101)
MONO#: 0.6 10*3/uL (ref 0.1–0.9)
MONO%: 8.8 % (ref 0.0–13.0)
NEUT#: 5.3 10*3/uL (ref 1.5–6.5)
NEUT%: 74.4 % (ref 39.6–80.0)
Platelets: 277 10*3/uL (ref 145–400)
RBC: 4.2 10*6/uL (ref 3.70–5.32)
RDW: 18.3 % — ABNORMAL HIGH (ref 11.1–15.7)
WBC: 7.1 10*3/uL (ref 3.9–10.0)

## 2016-11-19 LAB — COMPREHENSIVE METABOLIC PANEL
ALBUMIN: 3.8 g/dL (ref 3.5–5.0)
ALK PHOS: 90 U/L (ref 40–150)
ALT: 11 U/L (ref 0–55)
ANION GAP: 7 meq/L (ref 3–11)
AST: 14 U/L (ref 5–34)
BILIRUBIN TOTAL: 0.22 mg/dL (ref 0.20–1.20)
BUN: 12.7 mg/dL (ref 7.0–26.0)
CALCIUM: 9 mg/dL (ref 8.4–10.4)
CO2: 21 mEq/L — ABNORMAL LOW (ref 22–29)
Chloride: 110 mEq/L — ABNORMAL HIGH (ref 98–109)
Creatinine: 0.8 mg/dL (ref 0.6–1.1)
EGFR: >90 mL/min/{1.73_m2} (ref >90)
Glucose: 107 mg/dl (ref 70–140)
POTASSIUM: 3.9 meq/L (ref 3.5–5.1)
SODIUM: 138 meq/L (ref 136–145)
TOTAL PROTEIN: 7.5 g/dL (ref 6.4–8.3)

## 2016-11-19 LAB — CHCC SATELLITE - SMEAR

## 2016-11-19 LAB — IRON AND TIBC
%SAT: 4 % — ABNORMAL LOW (ref 21–57)
IRON: 14 ug/dL — AB (ref 41–142)
TIBC: 391 ug/dL (ref 236–444)
UIBC: 377 ug/dL (ref 120–384)

## 2016-11-19 LAB — FERRITIN: Ferritin: 6 ng/ml — ABNORMAL LOW (ref 9–269)

## 2016-11-20 ENCOUNTER — Ambulatory Visit (HOSPITAL_BASED_OUTPATIENT_CLINIC_OR_DEPARTMENT_OTHER): Payer: BLUE CROSS/BLUE SHIELD | Admitting: Family

## 2016-11-20 ENCOUNTER — Ambulatory Visit: Payer: BLUE CROSS/BLUE SHIELD | Admitting: Family

## 2016-11-20 ENCOUNTER — Ambulatory Visit: Payer: BLUE CROSS/BLUE SHIELD

## 2016-11-20 VITALS — BP 128/64 | HR 73 | Temp 97.8°F | Resp 17 | Wt 174.0 lb

## 2016-11-20 DIAGNOSIS — Z720 Tobacco use: Secondary | ICD-10-CM | POA: Diagnosis not present

## 2016-11-20 DIAGNOSIS — Z832 Family history of diseases of the blood and blood-forming organs and certain disorders involving the immune mechanism: Secondary | ICD-10-CM | POA: Diagnosis not present

## 2016-11-20 DIAGNOSIS — D500 Iron deficiency anemia secondary to blood loss (chronic): Secondary | ICD-10-CM

## 2016-11-20 DIAGNOSIS — N920 Excessive and frequent menstruation with regular cycle: Secondary | ICD-10-CM

## 2016-11-20 LAB — HEMOGLOBINOPATHY EVALUATION
HEMOGLOBIN F QUANTITATION: 0 % (ref 0.0–2.0)
HGB C: 0 %
HGB S: 0 %
HGB VARIANT: 0 %
Hemoglobin A2 Quantitation: 1.7 % — ABNORMAL LOW (ref 1.8–3.2)
Hgb A: 98.3 % (ref 96.4–98.8)

## 2016-11-20 LAB — RETICULOCYTES: RETICULOCYTE COUNT: 1.5 % (ref 0.6–2.6)

## 2016-11-20 LAB — ERYTHROPOIETIN: ERYTHROPOIETIN: 65.9 m[IU]/mL — AB (ref 2.6–18.5)
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
## 2016-11-23 ENCOUNTER — Ambulatory Visit: Payer: BLUE CROSS/BLUE SHIELD

## 2016-11-23 ENCOUNTER — Encounter: Payer: Self-pay | Admitting: Family

## 2016-11-23 DIAGNOSIS — D500 Iron deficiency anemia secondary to blood loss (chronic): Secondary | ICD-10-CM | POA: Insufficient documentation

## 2016-11-23 HISTORY — DX: Iron deficiency anemia secondary to blood loss (chronic): D50.0

## 2016-11-24 ENCOUNTER — Ambulatory Visit (HOSPITAL_BASED_OUTPATIENT_CLINIC_OR_DEPARTMENT_OTHER): Payer: BLUE CROSS/BLUE SHIELD

## 2016-11-24 VITALS — BP 111/51 | HR 63 | Temp 98.8°F | Resp 16

## 2016-11-24 DIAGNOSIS — N920 Excessive and frequent menstruation with regular cycle: Secondary | ICD-10-CM | POA: Diagnosis not present

## 2016-11-24 DIAGNOSIS — D500 Iron deficiency anemia secondary to blood loss (chronic): Secondary | ICD-10-CM

## 2016-11-24 MED ORDER — SODIUM CHLORIDE 0.9 % IV SOLN
Freq: Once | INTRAVENOUS | Status: AC
  Administered 2016-11-24: 11:00:00 via INTRAVENOUS

## 2016-11-24 MED ORDER — SODIUM CHLORIDE 0.9 % IV SOLN
510.0000 mg | Freq: Once | INTRAVENOUS | Status: AC
  Administered 2016-11-24: 510 mg via INTRAVENOUS
  Filled 2016-11-24: qty 17

## 2016-11-24 NOTE — Patient Instructions (Signed)
Anemia, Nonspecific Anemia is a condition in which the concentration of red blood cells or hemoglobin in the blood is below normal. Hemoglobin is a substance in red blood cells that carries oxygen to the tissues of the body. Anemia results in not enough oxygen reaching these tissues. What are the causes? Common causes of anemia include:  Excessive bleeding. Bleeding may be internal or external. This includes excessive bleeding from periods (in women) or from the intestine.  Poor nutrition.  Chronic kidney, thyroid, and liver disease.  Bone marrow disorders that decrease red blood cell production.  Cancer and treatments for cancer.  HIV, AIDS, and their treatments.  Spleen problems that increase red blood cell destruction.  Blood disorders.  Excess destruction of red blood cells due to infection, medicines, and autoimmune disorders. What are the signs or symptoms?  Minor weakness.  Dizziness.  Headache.  Palpitations.  Shortness of breath, especially with exercise.  Paleness.  Cold sensitivity.  Indigestion.  Nausea.  Difficulty sleeping.  Difficulty concentrating. Symptoms may occur suddenly or they may develop slowly. How is this diagnosed? Additional blood tests are often needed. These help your health care provider determine the best treatment. Your health care provider will check your stool for blood and look for other causes of blood loss. How is this treated? Treatment varies depending on the cause of the anemia. Treatment can include:  Supplements of iron, vitamin B12, or folic acid.  Hormone medicines.  A blood transfusion. This may be needed if blood loss is severe.  Hospitalization. This may be needed if there is significant continual blood loss.  Dietary changes.  Spleen removal. Follow these instructions at home: Keep all follow-up appointments. It often takes many weeks to correct anemia, and having your health care provider check on your  condition and your response to treatment is very important. Get help right away if:  You develop extreme weakness, shortness of breath, or chest pain.  You become dizzy or have trouble concentrating.  You develop heavy vaginal bleeding.  You develop a rash.  You have bloody or black, tarry stools.  You faint.  You vomit up blood.  You vomit repeatedly.  You have abdominal pain.  You have a fever or persistent symptoms for more than 2-3 days.  You have a fever and your symptoms suddenly get worse.  You are dehydrated. This information is not intended to replace advice given to you by your health care provider. Make sure you discuss any questions you have with your health care provider. Document Released: 10/22/2004 Document Revised: 02/26/2016 Document Reviewed: 03/10/2013 Elsevier Interactive Patient Education  2017 Elsevier Inc.  

## 2016-11-25 ENCOUNTER — Ambulatory Visit (INDEPENDENT_AMBULATORY_CARE_PROVIDER_SITE_OTHER): Payer: BLUE CROSS/BLUE SHIELD | Admitting: Family Medicine

## 2016-11-25 ENCOUNTER — Ambulatory Visit: Payer: BLUE CROSS/BLUE SHIELD

## 2016-11-25 VITALS — BP 122/72 | HR 73 | Temp 98.7°F | Resp 17 | Ht 69.5 in | Wt 176.0 lb

## 2016-11-25 DIAGNOSIS — D500 Iron deficiency anemia secondary to blood loss (chronic): Secondary | ICD-10-CM | POA: Diagnosis not present

## 2016-11-25 DIAGNOSIS — Z720 Tobacco use: Secondary | ICD-10-CM

## 2016-11-25 DIAGNOSIS — Z716 Tobacco abuse counseling: Secondary | ICD-10-CM | POA: Diagnosis not present

## 2016-11-25 DIAGNOSIS — I951 Orthostatic hypotension: Secondary | ICD-10-CM

## 2016-11-25 NOTE — Patient Instructions (Addendum)
It was great to see you again today -- I'm so glad you're heading in the right direction!    We'll get you back April 11.    For the nicotine gum:    Chewing the gum releases nicotine to be absorbed through the mouth, resulting in peak blood nicotine levels 20 minutes after starting to chew. Nicotine gum is available in several flavors that most users find preferable to the original flavor.  Dosing, duration and instructions for use:   .< 25 cigarettes per day - 2 mg dose of gum is recommended   -Chew at least one piece of gum every one to two hours while awake and also whenever there is an urge to smoke.  ?- You can use up to 24 pieces of gum per day for four weeks.  - Gradually reduce use over a second four weeks, for a total duration of three months.   "Chew and park" is recommended: chew the gum until the nicotine taste appears, then "park" the gum in the buccal mucosa until the taste disappears, then chew a few more times to release more nicotine. Repeat this for 30 minutes, then discard the gum.  - Acidic beverages (eg, coffee, carbonated drinks) should be avoided before and during gum use, as acidic beverages lower oral pH, which causes nicotine to ionize and reduces nicotine absorption.    IF you received an x-ray today, you will receive an invoice from [facility]. Please contact [facility] at [phone] with questions or concerns regarding your invoice.   IF you received labwork today, you will receive an invoice from [facility]. Please contact [facility] at [phone] with questions or concerns regarding your invoice.   Our billing staff will not be able to assist you with questions regarding bills from these companies.  You will be contacted with the lab results as soon as they are available. The fastest way to get your results is to activate your My Chart account. Instructions are located on the last page of this paperwork. If you have not heard from us  regarding the results in 2 weeks, please contact this office.
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
## 2016-11-26 ENCOUNTER — Telehealth: Payer: Self-pay | Admitting: *Deleted

## 2016-11-26 NOTE — Telephone Encounter (Signed)
Thank you for letting me know about this.  I wasn't aware.

## 2016-11-26 NOTE — Telephone Encounter (Signed)
Faxed medical forms to [facility], per patient's request. Confirmation page received.

## 2016-11-26 NOTE — Telephone Encounter (Signed)
Faxed medical forms completed with letter by Dr Gwendolyn GrantWalden on 11/25/2016. Patient requested the forms to be faxed by the office. They were re faxed with an up dated signature on 11/26/2016 because the previous forms received a failure [place] notice. Confirmation page received at 11/26/2016 at 6:49 pm. Luther ParodyCaitlin advised me to put forms on her desk to be scan into patient's chart and fee will be charged.

## 2016-11-26 NOTE — Telephone Encounter (Signed)
These forms need to be placed in the FMLA box at the [employer] checkout desk because this is the 3rd set of forms she has had completed by Dr Gwendolyn GrantWalden and none have been scanned into the chart and patient has not been charged the [employer] fee. Please place them here. Thank you!

## 2016-12-02 ENCOUNTER — Ambulatory Visit (HOSPITAL_BASED_OUTPATIENT_CLINIC_OR_DEPARTMENT_OTHER): Payer: BLUE CROSS/BLUE SHIELD

## 2016-12-02 VITALS — BP 122/63 | HR 58 | Temp 97.8°F | Resp 16

## 2016-12-02 DIAGNOSIS — N920 Excessive and frequent menstruation with regular cycle: Secondary | ICD-10-CM

## 2016-12-02 DIAGNOSIS — D500 Iron deficiency anemia secondary to blood loss (chronic): Secondary | ICD-10-CM

## 2016-12-02 LAB — ALPHA-THALASSEMIA GENOTYPR

## 2016-12-02 MED ORDER — SODIUM CHLORIDE 0.9 % IV SOLN
Freq: Once | INTRAVENOUS | Status: AC
  Administered 2016-12-02: 12:00:00 via INTRAVENOUS

## 2016-12-02 MED ORDER — SODIUM CHLORIDE 0.9 % IV SOLN
510.0000 mg | Freq: Once | INTRAVENOUS | Status: AC
  Administered 2016-12-02: 510 mg via INTRAVENOUS
  Filled 2016-12-02: qty 17

## 2016-12-02 NOTE — Patient Instructions (Signed)
Anemia, Nonspecific Anemia is a condition in which the concentration of red blood cells or hemoglobin in the blood is below normal. Hemoglobin is a substance in red blood cells that carries oxygen to the tissues of the body. Anemia results in not enough oxygen reaching these tissues. What are the causes? Common causes of anemia include:  Excessive bleeding. Bleeding may be internal or external. This includes excessive bleeding from periods (in women) or from the intestine.  Poor nutrition.  Chronic kidney, thyroid, and liver disease.  Bone marrow disorders that decrease red blood cell production.  Cancer and treatments for cancer.  HIV, AIDS, and their treatments.  Spleen problems that increase red blood cell destruction.  Blood disorders.  Excess destruction of red blood cells due to infection, medicines, and autoimmune disorders. What are the signs or symptoms?  Minor weakness.  Dizziness.  Headache.  Palpitations.  Shortness of breath, especially with exercise.  Paleness.  Cold sensitivity.  Indigestion.  Nausea.  Difficulty sleeping.  Difficulty concentrating. Symptoms may occur suddenly or they may develop slowly. How is this diagnosed? Additional blood tests are often needed. These help your health care provider determine the best treatment. Your health care provider will check your stool for blood and look for other causes of blood loss. How is this treated? Treatment varies depending on the cause of the anemia. Treatment can include:  Supplements of iron, vitamin B12, or folic acid.  Hormone medicines.  A blood transfusion. This may be needed if blood loss is severe.  Hospitalization. This may be needed if there is significant continual blood loss.  Dietary changes.  Spleen removal. Follow these instructions at home: Keep all follow-up appointments. It often takes many weeks to correct anemia, and having your health care provider check on your  condition and your response to treatment is very important. Get help right away if:  You develop extreme weakness, shortness of breath, or chest pain.  You become dizzy or have trouble concentrating.  You develop heavy vaginal bleeding.  You develop a rash.  You have bloody or black, tarry stools.  You faint.  You vomit up blood.  You vomit repeatedly.  You have abdominal pain.  You have a fever or persistent symptoms for more than 2-3 days.  You have a fever and your symptoms suddenly get worse.  You are dehydrated. This information is not intended to replace advice given to you by your health care provider. Make sure you discuss any questions you have with your health care provider. Document Released: 10/22/2004 Document Revised: 02/26/2016 Document Reviewed: 03/10/2013 Elsevier Interactive Patient Education  2017 Elsevier Inc.  

## 2016-12-10 DIAGNOSIS — L700 Acne vulgaris: Secondary | ICD-10-CM | POA: Diagnosis not present

## 2016-12-10 DIAGNOSIS — L810 Postinflammatory hyperpigmentation: Secondary | ICD-10-CM | POA: Diagnosis not present

## 2017-01-01 ENCOUNTER — Ambulatory Visit: Payer: Medicare Other | Admitting: Family

## 2017-01-01 ENCOUNTER — Other Ambulatory Visit: Payer: Medicare Other

## 2017-01-04 ENCOUNTER — Telehealth: Payer: Self-pay | Admitting: Obstetrics and Gynecology

## 2017-01-05 ENCOUNTER — Institutional Professional Consult (permissible substitution): Payer: BLUE CROSS/BLUE SHIELD | Admitting: Obstetrics and Gynecology

## 2017-01-05 ENCOUNTER — Ambulatory Visit (INDEPENDENT_AMBULATORY_CARE_PROVIDER_SITE_OTHER): Payer: BLUE CROSS/BLUE SHIELD | Admitting: Obstetrics and Gynecology

## 2017-01-05 ENCOUNTER — Encounter: Payer: Self-pay | Admitting: Obstetrics and Gynecology

## 2017-01-05 VITALS — BP 112/70 | HR 80 | Resp 14 | Wt 181.0 lb

## 2017-01-05 DIAGNOSIS — Z30011 Encounter for initial prescription of contraceptive pills: Secondary | ICD-10-CM

## 2017-01-05 DIAGNOSIS — N920 Excessive and frequent menstruation with regular cycle: Secondary | ICD-10-CM

## 2017-01-05 DIAGNOSIS — N946 Dysmenorrhea, unspecified: Secondary | ICD-10-CM

## 2017-01-05 DIAGNOSIS — D500 Iron deficiency anemia secondary to blood loss (chronic): Secondary | ICD-10-CM | POA: Diagnosis not present

## 2017-01-05 MED ORDER — NAPROXEN SODIUM 550 MG PO TABS: 550.0000 mg | ORAL_TABLET | Freq: Two times a day (BID) | ORAL | 2 refills | Status: AC

## 2017-01-05 MED ORDER — NORETHIN ACE-ETH ESTRAD-FE 1-20 MG-MCG PO TABS: 1.0000 | ORAL_TABLET | Freq: Every day | ORAL | 0 refills | Status: DC

## 2017-01-05 NOTE — Patient Instructions (Signed)
Oral Contraception Information Oral contraceptive pills (OCPs) are medicines taken to prevent pregnancy. OCPs work by preventing the ovaries from releasing eggs. The hormones in OCPs also cause the cervical mucus to thicken, preventing the sperm from entering the uterus. The hormones also cause the uterine lining to become thin, not allowing a fertilized egg to attach to the inside of the uterus. OCPs are highly effective when taken exactly as prescribed. However, OCPs do not prevent sexually transmitted diseases (STDs). Safe sex practices, such as using condoms along with the pill, can help prevent STDs. Before taking the pill, you may have a physical exam and Pap test. Your health care provider may order blood tests. The health care provider will make sure you are a good candidate for oral contraception. Discuss with your health care provider the possible side effects of the OCP you may be prescribed. When starting an OCP, it can take 2 to 3 months for the body to adjust to the changes in hormone levels in your body. Types of oral contraception  The combination pill-This pill contains estrogen and progestin (synthetic progesterone) hormones. The combination pill comes in 21-day, 28-day, or 91-day packs. Some types of combination pills are meant to be taken continuously (365-day pills). With 21-day packs, you do not take pills for 7 days after the last pill. With 28-day packs, the pill is taken every day. The last 7 pills are without hormones. Certain types of pills have more than 21 hormone-containing pills. With 91-day packs, the first 84 pills contain both hormones, and the last 7 pills contain no hormones or contain estrogen only.  The minipill-This pill contains the progesterone hormone only. The pill is taken every day continuously. It is very important to take the pill at the same time each day. The minipill comes in packs of 28 pills. All 28 pills contain the hormone. Advantages of oral  contraceptive pills  Decreases premenstrual symptoms.  Treats menstrual period cramps.  Regulates the menstrual cycle.  Decreases a heavy menstrual flow.  May treatacne, depending on the type of pill.  Treats abnormal uterine bleeding.  Treats polycystic ovarian syndrome.  Treats endometriosis.  Can be used as emergency contraception. Things that can make oral contraceptive pills less effective OCPs can be less effective if:  You forget to take the pill at the same time every day.  You have a stomach or intestinal disease that lessens the absorption of the pill.  You take OCPs with other medicines that make OCPs less effective, such as antibiotics, certain HIV medicines, and some seizure medicines.  You take expired OCPs.  You forget to restart the pill on day 7, when using the packs of 21 pills. Risks associated with oral contraceptive pills Oral contraceptive pills can sometimes cause side effects, such as:  Headache.  Nausea.  Breast tenderness.  Irregular bleeding or spotting. Combination pills are also associated with a small increased risk of:  Blood clots.  Heart attack.  Stroke. This information is not intended to replace advice given to you by your health care provider. Make sure you discuss any questions you have with your health care provider. Document Released: 12/05/2002 Document Revised: 02/20/2016 Document Reviewed: 03/05/2013 Elsevier Interactive Patient Education  2017 Elsevier Inc.  
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
## 2017-01-06 ENCOUNTER — Ambulatory Visit (INDEPENDENT_AMBULATORY_CARE_PROVIDER_SITE_OTHER): Payer: BLUE CROSS/BLUE SHIELD | Admitting: Family Medicine

## 2017-01-06 VITALS — BP 137/71 | HR 60 | Temp 98.9°F | Ht 69.5 in | Wt 179.2 lb

## 2017-01-06 DIAGNOSIS — D500 Iron deficiency anemia secondary to blood loss (chronic): Secondary | ICD-10-CM | POA: Diagnosis not present

## 2017-01-06 DIAGNOSIS — Z87891 Personal history of nicotine dependence: Secondary | ICD-10-CM

## 2017-01-06 NOTE — Patient Instructions (Signed)
     IF you received an x-ray today, you will receive an invoice from [facility]. Please contact [facility] at [phone] with questions or concerns regarding your invoice.   IF you received labwork today, you will receive an invoice from [facility]. Please contact [facility] at [phone] with questions or concerns regarding your invoice.   Our billing staff will not be able to assist you with questions regarding bills from these companies.  You will be contacted with the lab results as soon as they are available. The fastest way to get your results is to activate your My Chart account. Instructions are located on the last page of this paperwork. If you have not heard from us regarding the results in 2 weeks, please contact this office.     
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
## 2017-01-07 ENCOUNTER — Other Ambulatory Visit: Payer: BLUE CROSS/BLUE SHIELD

## 2017-01-07 ENCOUNTER — Institutional Professional Consult (permissible substitution): Payer: BLUE CROSS/BLUE SHIELD | Admitting: Obstetrics and Gynecology

## 2017-01-07 ENCOUNTER — Other Ambulatory Visit (HOSPITAL_BASED_OUTPATIENT_CLINIC_OR_DEPARTMENT_OTHER): Payer: BLUE CROSS/BLUE SHIELD

## 2017-01-07 ENCOUNTER — Ambulatory Visit: Payer: Medicare Other | Admitting: Family

## 2017-01-07 ENCOUNTER — Ambulatory Visit (HOSPITAL_BASED_OUTPATIENT_CLINIC_OR_DEPARTMENT_OTHER): Payer: BLUE CROSS/BLUE SHIELD | Admitting: Family

## 2017-01-07 VITALS — BP 121/55 | HR 54 | Temp 98.8°F | Resp 18 | Wt 181.4 lb

## 2017-01-07 DIAGNOSIS — D500 Iron deficiency anemia secondary to blood loss (chronic): Secondary | ICD-10-CM

## 2017-01-07 DIAGNOSIS — N920 Excessive and frequent menstruation with regular cycle: Secondary | ICD-10-CM

## 2017-01-07 LAB — CBC WITH DIFFERENTIAL (CANCER CENTER ONLY)
BASO#: 0 10*3/uL (ref 0.0–0.2)
BASO%: 0.5 % (ref 0.0–2.0)
EOS%: 5.4 % (ref 0.0–7.0)
Eosinophils Absolute: 0.2 10*3/uL (ref 0.0–0.5)
HEMATOCRIT: 37.6 % (ref 34.8–46.6)
HEMOGLOBIN: 12.4 g/dL (ref 11.6–15.9)
LYMPH#: 1.2 10*3/uL (ref 0.9–3.3)
LYMPH%: 30.2 % (ref 14.0–48.0)
MCH: 27.2 pg (ref 26.0–34.0)
MCHC: 33 g/dL (ref 32.0–36.0)
MCV: 83 fL (ref 81–101)
MONO#: 0.6 10*3/uL (ref 0.1–0.9)
MONO%: 15.4 % — ABNORMAL HIGH (ref 0.0–13.0)
NEUT%: 48.5 % (ref 39.6–80.0)
NEUTROS ABS: 2 10*3/uL (ref 1.5–6.5)
Platelets: 208 10*3/uL (ref 145–400)
RBC: 4.56 10*6/uL (ref 3.70–5.32)
RDW: 22.7 % — ABNORMAL HIGH (ref 11.1–15.7)
WBC: 4.1 10*3/uL (ref 3.9–10.0)

## 2017-01-07 LAB — IRON AND TIBC
%SAT: 24 % (ref 21–57)
IRON: 65 ug/dL (ref 41–142)
TIBC: 271 ug/dL (ref 236–444)
UIBC: 207 ug/dL (ref 120–384)

## 2017-01-07 LAB — FERRITIN: Ferritin: 36 ng/ml (ref 9–269)

## 2017-01-07 NOTE — Progress Notes (Signed)
Hematology and Oncology Follow Up Visit  Aamna Mallozzi [id number] Aug 17, 1976 41 y.o. 01/07/2017   Principle Diagnosis:  Iron deficeincy anemia secondary to menorrhagia   Current Therapy:   IV iron as indicated    Interim History:  Ms. Lovecchio is here today for follow-up. She is doing well since receiving 2 doses of IV iron last month. Her Hgb is now up to 12.4 with an MCV of 83. Iron studies are pending.  Her symptoms have almost completely resolved. She is still having a little "brain fog" at times but otherwise has no complaints.  Her cycles continue to be heavy and regular. She has stopped smoking and her gynecologist plans to start her on a new birth control after her next cycle later this month.  No fever, chills, n/v, cough, rash, dizziness, SOB, chest pain, palpitations, abdominal pain or changes in bowel or bladder habits.  No swelling, tenderness, numbness or tingling in her extremities. No c/o pain.  She has maintained a good healthy appetite and is staying well hydrated. Her weight is stable.  She plans to join a gym now that her energy has improved and start working on losing some weight.   ECOG Performance Status: 1 - Symptomatic but completely ambulatory  Medications:  Allergies as of 01/07/2017      Reactions   Bactrim [sulfamethoxazole-trimethoprim] Shortness Of Breath   Ciprofloxacin    Fish Allergy    Peanut-containing Drug Products    Strawberry (diagnostic)    Tylenol [acetaminophen]       Medication List       Accurate as of 01/07/17 11:39 AM. Always use your most recent med list.          EPIPEN IJ Inject as directed.   ferrous sulfate 160 (50 Fe) MG Tbcr SR tablet Commonly known as:  SLOW IRON Take 1 tablet (160 mg total) by mouth 2 (two) times daily.   naproxen sodium 550 MG tablet Commonly known as:  ANAPROX DS Take 1 tablet (550 mg total) by mouth 2 (two) times daily with a meal.   norethindrone-ethinyl estradiol 1-20 MG-MCG  tablet Commonly known as:  JUNEL FE,GILDESS FE,LOESTRIN FE Take 1 tablet by mouth daily.   Vitamin D (Ergocalciferol) 50000 units Caps capsule Commonly known as:  DRISDOL Take 1 capsule (50,000 Units total) by mouth every 7 (seven) days.       Allergies:  Allergies  Allergen Reactions  . Bactrim [Sulfamethoxazole-Trimethoprim] Shortness Of Breath  . Ciprofloxacin   . Fish Allergy   . Peanut-Containing Drug Products   . Strawberry (Diagnostic)   . Tylenol [Acetaminophen]     Past Medical History, Surgical history, Social history, and Family History were reviewed and updated.  Review of Systems: All other 10 point review of systems is negative.   Physical Exam:  weight is 181 lb 6.4 oz (82.3 kg). Her oral temperature is 98.8 F (37.1 C). Her blood pressure is 121/55 (abnormal) and her pulse is 54 (abnormal). Her respiration is 18.   Wt Readings from Last 3 Encounters:  01/07/17 181 lb 6.4 oz (82.3 kg)  01/06/17 179 lb 3.2 oz (81.3 kg)  01/05/17 181 lb (82.1 kg)    Ocular: Sclerae unicteric, pupils equal, round and reactive to light Ear-nose-throat: Oropharynx clear, dentition fair Lymphatic: No cervical, supraclavicular or axillary adenopathy Lungs no rales or rhonchi, good excursion bilaterally Heart regular rate and rhythm, no murmur appreciated Abd soft, nontender, positive bowel sounds, no liver or spleen tip palpated on exam, no  fluid wave  MSK no focal spinal tenderness, no joint edema Neuro: non-focal, well-oriented, appropriate affect Breasts: Deferred  Lab Results  Component Value Date   WBC 4.1 01/07/2017   HGB 12.4 01/07/2017   HCT 37.6 01/07/2017   MCV 83 01/07/2017   PLT 208 01/07/2017   Lab Results  Component Value Date   FERRITIN 6 (L) 11/19/2016   IRON 14 (L) 11/19/2016   TIBC 391 11/19/2016   UIBC 377 11/19/2016   IRONPCTSAT 4 (L) 11/19/2016   Lab Results  Component Value Date   RBC 4.56 01/07/2017   No results found for: KPAFRELGTCHN,  LAMBDASER, KAPLAMBRATIO No results found for: IGGSERUM, IGA, IGMSERUM No results found for: Dorene Ar, A1GS, A2GS, Karn Pickler, SPEI   Chemistry      Component Value Date/Time   NA 138 11/19/2016 0940   K 3.9 11/19/2016 0940   CL 103 09/07/2016 1730   CO2 21 (L) 11/19/2016 0940   BUN 12.7 11/19/2016 0940   CREATININE 0.8 11/19/2016 0940      Component Value Date/Time   CALCIUM 9.0 11/19/2016 0940   ALKPHOS 90 11/19/2016 0940   AST 14 11/19/2016 0940   ALT 11 11/19/2016 0940   BILITOT 0.22 11/19/2016 0940     Impression and Plan: Ms Kernes is a very pleasant 41 yo African American female with history of iron deficiency anemia secondary to menorrhagia. She has responded nicely to the IV iron she received last month and is feeling much better. Her CBC has improved quite a bit. Hgb is now 12.4 with an MCV of 83.  We will see what her iron studies show and bring her back in next week for an infusion if needed.  We will plan to see her back in 2 months for repeat lab work and follow-up.  I spent 15 minutes face to face counseling the patient.  She will contact our office with any questions or concerns. We can certainly see her sooner if need be.   Verdie Mosher, NP 4/12/201811:39 AM

## 2017-01-08 ENCOUNTER — Telehealth: Payer: Self-pay | Admitting: *Deleted

## 2017-01-08 NOTE — Telephone Encounter (Signed)
erro  neous encounter

## 2017-01-08 NOTE — Telephone Encounter (Addendum)
Patient is aware of results.   ----- Message from Verdie Mosher, NP sent at 01/07/2017  5:07 PM EDT ----- Regarding: Iron  Iron studies look great! No infusion needed at this time. Thank you!  Sarah   ----- Message ----- From: Interface, Lab In Three Zero One Sent: 01/07/2017  11:12 AM To: Verdie Mosher, NP

## 2017-02-05 ENCOUNTER — Ambulatory Visit (INDEPENDENT_AMBULATORY_CARE_PROVIDER_SITE_OTHER): Payer: BLUE CROSS/BLUE SHIELD | Admitting: Physician Assistant

## 2017-02-05 ENCOUNTER — Encounter: Payer: Self-pay | Admitting: Physician Assistant

## 2017-02-05 VITALS — BP 111/56 | HR 72 | Temp 99.5°F | Resp 16 | Ht 68.0 in | Wt 183.4 lb

## 2017-02-05 DIAGNOSIS — Z09 Encounter for follow-up examination after completed treatment for conditions other than malignant neoplasm: Secondary | ICD-10-CM

## 2017-02-05 DIAGNOSIS — Z7689 Persons encountering health services in other specified circumstances: Secondary | ICD-10-CM | POA: Diagnosis not present
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
## 2017-02-05 NOTE — Patient Instructions (Signed)
     IF you received an x-ray today, you will receive an invoice from [facility]. Please contact [facility] at [phone] with questions or concerns regarding your invoice.   IF you received labwork today, you will receive an invoice from [facility]. Please contact [facility] at [phone] with questions or concerns regarding your invoice.   Our billing staff will not be able to assist you with questions regarding bills from these companies.  You will be contacted with the lab results as soon as they are available. The fastest way to get your results is to activate your My Chart account. Instructions are located on the last page of this paperwork. If you have not heard from us regarding the results in 2 weeks, please contact this office.     

## 2017-02-08 ENCOUNTER — Ambulatory Visit (INDEPENDENT_AMBULATORY_CARE_PROVIDER_SITE_OTHER): Payer: BLUE CROSS/BLUE SHIELD | Admitting: Obstetrics and Gynecology

## 2017-02-08 ENCOUNTER — Encounter: Payer: Self-pay | Admitting: Obstetrics and Gynecology

## 2017-02-08 VITALS — BP 118/60 | HR 80 | Resp 16 | Wt 183.0 lb

## 2017-02-08 DIAGNOSIS — N926 Irregular menstruation, unspecified: Secondary | ICD-10-CM | POA: Diagnosis not present

## 2017-02-08 DIAGNOSIS — Z113 Encounter for screening for infections with a predominantly sexual mode of transmission: Secondary | ICD-10-CM

## 2017-02-08 DIAGNOSIS — R635 Abnormal weight gain: Secondary | ICD-10-CM

## 2017-02-08 DIAGNOSIS — N760 Acute vaginitis: Secondary | ICD-10-CM

## 2017-02-08 LAB — TSH: TSH: 0.91 mIU/L

## 2017-02-08 LAB — POCT URINE PREGNANCY: PREG TEST UR: NEGATIVE

## 2017-02-08 MED ORDER — METRONIDAZOLE 0.75 % VA GEL: 1.0000 | Freq: Every day | VAGINAL | 0 refills | Status: DC

## 2017-02-08 MED ORDER — BETAMETHASONE VALERATE 0.1 % EX OINT: TOPICAL_OINTMENT | CUTANEOUS | 0 refills | Status: DC
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
## 2017-02-08 NOTE — Patient Instructions (Signed)
Vaginitis Vaginitis is an inflammation of the vagina. It is most often caused by a change in the normal balance of the bacteria and yeast that live in the vagina. This change in balance causes an overgrowth of certain bacteria or yeast, which causes the inflammation. There are different types of vaginitis, but the most common types are:  Bacterial vaginosis.  Yeast infection (candidiasis).  Trichomoniasis vaginitis. This is a sexually transmitted infection (STI).  Viral vaginitis.  Atrophic vaginitis.  Allergic vaginitis. What are the causes? The cause depends on the type of vaginitis. Vaginitis can be caused by:  Bacteria (bacterial vaginosis).  Yeast (yeast infection).  A parasite (trichomoniasis vaginitis)  A virus (viral vaginitis).  Low hormone levels (atrophic vaginitis). Low hormone levels can occur during pregnancy, breastfeeding, or after menopause.  Irritants, such as bubble baths, scented tampons, and feminine sprays (allergic vaginitis). Other factors can change the normal balance of the yeast and bacteria that live in the vagina. These include:  Antibiotic medicines.  Poor hygiene.  Diaphragms, vaginal sponges, spermicides, birth control pills, and intrauterine devices (IUD).  Sexual intercourse.  Infection.  Uncontrolled diabetes.  A weakened immune system. What are the signs or symptoms? Symptoms can vary depending on the cause of the vaginitis. Common symptoms include:  Abnormal vaginal discharge.  The discharge is white, gray, or yellow with bacterial vaginosis.  The discharge is thick, white, and cheesy with a yeast infection.  The discharge is frothy and yellow or greenish with trichomoniasis.  A bad vaginal odor.  The odor is fishy with bacterial vaginosis.  Vaginal itching, pain, or swelling.  Painful intercourse.  Pain or burning when urinating. Sometimes there are no symptoms. How is this treated? Treatment will vary depending on  the type of infection.  Bacterial vaginosis and trichomoniasis are often treated with antibiotic creams or pills.  Yeast infections are often treated with antifungal medicines, such as vaginal creams or suppositories.  Viral vaginitis has no cure, but symptoms can be treated with medicines that relieve discomfort. Your sexual partner should be treated as well.  Atrophic vaginitis may be treated with an estrogen cream, pill, suppository, or vaginal ring. If vaginal dryness occurs, lubricants and moisturizing creams may help. You may be told to avoid scented soaps, sprays, or douches.  Allergic vaginitis treatment involves quitting the use of the product that is causing the problem. Vaginal creams can be used to treat the symptoms. Follow these instructions at home:  Take all medicines as directed by your caregiver.  Keep your genital area clean and dry. Avoid soap and only rinse the area with water.  Avoid douching. It can remove the healthy bacteria in the vagina.  Do not use tampons or have sexual intercourse until your vaginitis has been treated. Use sanitary pads while you have vaginitis.  Wipe from front to back. This avoids the spread of bacteria from the rectum to the vagina.  Let air reach your genital area. ? Wear cotton underwear to decrease moisture buildup.  Avoid wearing underwear while you sleep until your vaginitis is gone.  Avoid tight pants and underwear or nylons without a cotton panel.  Take off wet clothing (especially bathing suits) as soon as possible.  Use mild, non-scented products. Avoid using irritants, such as:  Scented feminine sprays.  Fabric softeners.  Scented detergents.  Scented tampons.  Scented soaps or bubble baths.  Practice safe sex and use condoms. Condoms may prevent the spread of trichomoniasis and viral vaginitis. Contact a health care   provider if:  You have abdominal pain.  You have symptoms that last for more than 2-3  days.  You have a fever and your symptoms suddenly get worse. This information is not intended to replace advice given to you by your health care provider. Make sure you discuss any questions you have with your health care provider. Document Released: 07/12/2007 Document Revised: 08/05/2016 Document Reviewed: 08/05/2016 Elsevier Interactive Patient Education  2017 Elsevier Inc.  

## 2017-02-09 LAB — HSV(HERPES SIMPLEX VRS) I + II AB-IGG
HSV 1 GLYCOPROTEIN G AB, IGG: 5.35 {index} — AB (ref <0.90)
HSV 2 Glycoprotein G Ab, IgG: >23 Index — ABNORMAL HIGH (ref <0.90)

## 2017-02-09 LAB — GC/CHLAMYDIA PROBE AMP
CT Probe RNA: NOT DETECTED
GC Probe RNA: NOT DETECTED

## 2017-02-09 LAB — HEPATITIS C ANTIBODY: HCV Ab: NEGATIVE

## 2017-02-09 LAB — STD PANEL
HEP B S AG: NEGATIVE
HIV: NONREACTIVE

## 2017-02-09 LAB — WET PREP BY MOLECULAR PROBE
Candida species: NOT DETECTED
Gardnerella vaginalis: DETECTED — AB
Trichomonas vaginosis: DETECTED — AB

## 2017-02-09 LAB — HCG, QUANTITATIVE, PREGNANCY: hCG, Beta Chain, Quant, S: <2 m[IU]/mL

## 2017-02-10 ENCOUNTER — Ambulatory Visit (INDEPENDENT_AMBULATORY_CARE_PROVIDER_SITE_OTHER): Payer: BLUE CROSS/BLUE SHIELD | Admitting: Family Medicine

## 2017-02-10 ENCOUNTER — Encounter: Payer: Self-pay | Admitting: Family Medicine

## 2017-02-10 VITALS — BP 118/67 | HR 68 | Temp 98.6°F | Resp 17 | Ht 68.0 in | Wt 183.0 lb

## 2017-02-10 DIAGNOSIS — B9689 Other specified bacterial agents as the cause of diseases classified elsewhere: Secondary | ICD-10-CM | POA: Diagnosis not present

## 2017-02-10 DIAGNOSIS — N760 Acute vaginitis: Secondary | ICD-10-CM | POA: Diagnosis not present

## 2017-02-10 DIAGNOSIS — R42 Dizziness and giddiness: Secondary | ICD-10-CM

## 2017-02-10 DIAGNOSIS — G43809 Other migraine, not intractable, without status migrainosus: Secondary | ICD-10-CM

## 2017-02-10 DIAGNOSIS — D649 Anemia, unspecified: Secondary | ICD-10-CM | POA: Diagnosis not present

## 2017-02-10 LAB — HSV 1/2 AB (IGM), IFA W/RFLX TITER
HSV 1 IgM Screen: NEGATIVE
HSV 2 IGM SCREEN: NEGATIVE

## 2017-02-10 MED ORDER — METRONIDAZOLE 500 MG PO TABS: 500.0000 mg | ORAL_TABLET | Freq: Two times a day (BID) | ORAL | 0 refills | Status: DC

## 2017-02-10 NOTE — Patient Instructions (Addendum)
  It was good to see you again today.  I have completed your paperwork to work from home.  Make sure to set small, realistic goals.  Walking for 30 minutes 3 days a week is a good start.  Your goal should be 5 days a week to help lose weight.  Take the naproxen to help with migraines if they return.    I have sent in the Metronidazole for you.  Take this twice daily for 5 days.     IF you received an x-ray today, you will receive an invoice from [facility]. Please contact [facility] at [phone] with questions or concerns regarding your invoice.   IF you received labwork today, you will receive an invoice from [facility]. Please contact [facility] at [phone] with questions or concerns regarding your invoice.   Our billing staff will not be able to assist you with questions regarding bills from these companies.  You will be contacted with the lab results as soon as they are available. The fastest way to get your results is to activate your My Chart account. Instructions are located on the last page of this paperwork. If you have not heard from us regarding the results in 2 weeks, please contact this office.
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
## 2017-02-11 ENCOUNTER — Telehealth: Payer: Self-pay | Admitting: Family Medicine

## 2017-02-11 ENCOUNTER — Telehealth: Payer: Self-pay

## 2017-02-11 MED ORDER — PROMETHAZINE HCL 12.5 MG PO TABS: ORAL_TABLET | ORAL | 1 refills | Status: DC

## 2017-02-11 MED ORDER — METRONIDAZOLE 500 MG PO TABS: 500.0000 mg | ORAL_TABLET | Freq: Two times a day (BID) | ORAL | 0 refills | Status: DC

## 2017-02-11 NOTE — Telephone Encounter (Signed)
States n/a

## 2017-02-11 NOTE — Telephone Encounter (Signed)
Spoke with patient. Advised of message and results as seen below from Dr.Jertson. Patient verbalizes understanding. Rx for Flagyl 500 mg BID x 7 days #14 0RF and Phenergan 12.5 mg take 1-2 tablets po every 4 hours prn nausea #20 1RF sent to pharmacy on file. Aware she will need to abstain from intercourse until she and her partner have received treatment and for 1 week following. Will need to use condoms for protection against STD's with intercourse. Patient verbalizes understanding. Recheck scheduled for 03/08/2017 at 3:30 pm with Dr.Jertson. Patient is agreeable to date and time.  Routing to provider for final review. Patient agreeable to disposition. Will close encounter.

## 2017-02-11 NOTE — Telephone Encounter (Signed)
-----   Message from Romualdo BolkJill Evelyn Jertson, MD sent at 02/10/2017  5:30 PM EDT ----- Please let the patient know that her vaginitis panel returned with BV and trich. I called in the metrogel for her, but that won't treat the trich. Please call in flagyl 500 mg po BID x 7 days. In the past she had nausea with the Flagyl, please call in phenergan 12.5 mg 1-2 tablets po q 4 hours prn nausea (#20, 1 refill). Warn her that the phenergan can make her woozy/tired. She needs a f/u vaginitis panel 2 weeks after finishing the flagyl.  She should not be sexually active until at least one week after she and her partner have been treated (he will need to see his MD), then they should use condoms.  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  The rest of her lab work was negative.

## 2017-02-11 NOTE — Telephone Encounter (Signed)
Seen 5 16 18 

## 2017-02-11 NOTE — Telephone Encounter (Signed)
I thought it was pretty clear in the paperwork.  Due to her chronic dizziness, I wrote it wasn't safe for her to drive 2 hours to work.  She said she was trying to get accommodations to work from home.  I put this in her paperwork as well.  Thanks, JW

## 2017-02-11 NOTE — Telephone Encounter (Signed)
Lance Sellita M. With [employer] needs to verify if pt can return to work and if so are there any restrictions. States the paper work was not clear.  Contact number [phone]  [phone]#[id number]

## 2017-02-13 NOTE — Telephone Encounter (Signed)
N/a "states unavailable"

## 2017-02-15 ENCOUNTER — Telehealth: Payer: Self-pay | Admitting: Obstetrics and Gynecology

## 2017-02-15 NOTE — Telephone Encounter (Signed)
Patient calling for results.

## 2017-02-16 NOTE — Telephone Encounter (Signed)
Yes, please.

## 2017-02-16 NOTE — Telephone Encounter (Signed)
Spoke with patient. Advised of results as seen below. Patient verbalizes understanding. Requests to pick up a copy of lab results from 02/08/2017 and 10/22/2016. Advised labs will be printed and placed at the front desk for pick up at her convenience. Patient verbalizes understanding. Labs to front for pick up.  Routing to provider for final review. Patient agreeable to disposition. Will close encounter.

## 2017-02-16 NOTE — Telephone Encounter (Signed)
Dr.Jertson, okay to advise patient that her HSV IGM testing was negative for 1 and 2. Which indicates no current exposure, but has past exposure with IGG testing being positive?

## 2017-03-08 ENCOUNTER — Ambulatory Visit (HOSPITAL_BASED_OUTPATIENT_CLINIC_OR_DEPARTMENT_OTHER): Payer: BLUE CROSS/BLUE SHIELD | Admitting: Family

## 2017-03-08 ENCOUNTER — Encounter: Payer: Self-pay | Admitting: Obstetrics and Gynecology

## 2017-03-08 ENCOUNTER — Ambulatory Visit (INDEPENDENT_AMBULATORY_CARE_PROVIDER_SITE_OTHER): Payer: BLUE CROSS/BLUE SHIELD | Admitting: Obstetrics and Gynecology

## 2017-03-08 ENCOUNTER — Other Ambulatory Visit: Payer: Self-pay | Admitting: Obstetrics and Gynecology

## 2017-03-08 ENCOUNTER — Other Ambulatory Visit (HOSPITAL_BASED_OUTPATIENT_CLINIC_OR_DEPARTMENT_OTHER): Payer: BLUE CROSS/BLUE SHIELD

## 2017-03-08 VITALS — BP 124/73 | HR 64 | Temp 98.7°F | Resp 17 | Wt 184.0 lb

## 2017-03-08 VITALS — BP 118/60 | HR 64 | Resp 14 | Wt 185.0 lb

## 2017-03-08 DIAGNOSIS — N946 Dysmenorrhea, unspecified: Secondary | ICD-10-CM

## 2017-03-08 DIAGNOSIS — Z8619 Personal history of other infectious and parasitic diseases: Secondary | ICD-10-CM | POA: Diagnosis not present

## 2017-03-08 DIAGNOSIS — G43909 Migraine, unspecified, not intractable, without status migrainosus: Secondary | ICD-10-CM | POA: Diagnosis not present

## 2017-03-08 DIAGNOSIS — N761 Subacute and chronic vaginitis: Secondary | ICD-10-CM

## 2017-03-08 DIAGNOSIS — N920 Excessive and frequent menstruation with regular cycle: Secondary | ICD-10-CM

## 2017-03-08 DIAGNOSIS — D500 Iron deficiency anemia secondary to blood loss (chronic): Secondary | ICD-10-CM | POA: Diagnosis not present

## 2017-03-08 DIAGNOSIS — Z3009 Encounter for other general counseling and advice on contraception: Secondary | ICD-10-CM

## 2017-03-08 DIAGNOSIS — Z862 Personal history of diseases of the blood and blood-forming organs and certain disorders involving the immune mechanism: Secondary | ICD-10-CM | POA: Diagnosis not present

## 2017-03-08 DIAGNOSIS — E611 Iron deficiency: Secondary | ICD-10-CM

## 2017-03-08 LAB — CBC WITH DIFFERENTIAL/PLATELET
BASO%: 0.5 % (ref 0.0–2.0)
BASOS ABS: 0 10*3/uL (ref 0.0–0.1)
EOS ABS: 0.2 10*3/uL (ref 0.0–0.5)
EOS%: 5.5 % (ref 0.0–7.0)
HCT: 36.8 % (ref 34.8–46.6)
HEMOGLOBIN: 12.1 g/dL (ref 11.6–15.9)
LYMPH#: 1.2 10*3/uL (ref 0.9–3.3)
LYMPH%: 29.6 % (ref 14.0–49.7)
MCH: 27.7 pg (ref 25.1–34.0)
MCHC: 32.9 g/dL (ref 31.5–36.0)
MCV: 84.2 fL (ref 79.5–101.0)
MONO#: 0.6 10*3/uL (ref 0.1–0.9)
MONO%: 13.2 % (ref 0.0–14.0)
NEUT#: 2.1 10*3/uL (ref 1.5–6.5)
NEUT%: 51.2 % (ref 38.4–76.8)
NRBC: 0 % (ref 0–0)
PLATELETS: 257 10*3/uL (ref 145–400)
RBC: 4.37 10*6/uL (ref 3.70–5.45)
RDW: 14.4 % (ref 11.2–14.5)
WBC: 4.2 10*3/uL (ref 3.9–10.3)

## 2017-03-08 LAB — FERRITIN: Ferritin: 9 ng/ml (ref 9–269)

## 2017-03-08 LAB — IRON AND TIBC
%SAT: 8 % — ABNORMAL LOW (ref 21–57)
Iron: 27 ug/dL — ABNORMAL LOW (ref 41–142)
TIBC: 335 ug/dL (ref 236–444)
UIBC: 308 ug/dL (ref 120–384)
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
## 2017-03-08 NOTE — Patient Instructions (Signed)
Trichomoniasis Trichomoniasis is an STI (sexually transmitted infection) that can affect both women and men. In women, the outer area of the female genitalia (vulva) and the vagina are affected. In men, the penis is mainly affected, but the prostate and other reproductive organs can also be involved. This condition can be treated with medicine. It often has no symptoms (is asymptomatic), especially in men. What are the causes? This condition is caused by an organism called Trichomonas vaginalis. Trichomoniasis most often spreads from person to person (is contagious) through sexual contact. What increases the risk? The following factors may make you more likely to develop this condition:  Having unprotected sexual intercourse.  Having sexual intercourse with a partner who has trichomoniasis.  Having multiple sexual partners.  Having had previous trichomoniasis infections or other STIs.  What are the signs or symptoms? In women, symptoms of trichomoniasis include:  Abnormal vaginal discharge that is clear, white, gray, or yellow-green and foamy and has an unusual "fishy" odor.  Itching and irritation of the vagina and vulva.  Burning or pain during urination or sexual intercourse.  Genital redness and swelling.  In men, symptoms of trichomoniasis include:  Penile discharge that may be foamy or contain pus.  Pain in the penis. This may happen only when urinating.  Itching or irritation inside the penis.  Burning after urination or ejaculation.  How is this diagnosed? In women, this condition may be found during a routine Pap test or physical exam. It may be found in men during a routine physical exam. Your health care provider may perform tests to help diagnose this infection, such as:  Urine tests (men and women).  The following in women: ? Testing the pH of the vagina. ? A vaginal swab test that checks for the Trichomonas vaginalis organism. ? Testing vaginal  secretions.  Your health care provider may test you for other STIs, including HIV (human immunodeficiency virus). How is this treated? This condition is treated with medicine taken by mouth (orally), such as metronidazole or tinidazole to fight the infection. Your sexual partner(s) may also need to be tested and treated.  If you are a woman and you plan to become pregnant or think you may be pregnant, tell your health care provider right away. Some medicines that are used to treat the infection should not be taken during pregnancy.  Your health care provider may recommend over-the-counter medicines or creams to help relieve itching or irritation. You may be tested for infection again 3 months after treatment. Follow these instructions at home:  Take and use over-the-counter and prescription medicines, including creams, only as told by your health care provider.  Do not have sexual intercourse until one week after you finish your medicine, or until your health care provider approves. Ask your health care provider when you may resume sexual intercourse.  (Women) Do not douche or wear tampons while you have the infection.  Discuss your infection with your sexual partner(s). Make sure that your partner gets tested and treated, if necessary.  Keep all follow-up visits as told by your health care provider. This is important. How is this prevented?  Use condoms every time you have sex. Using condoms correctly and consistently can help protect against STIs.  Avoid having multiple sexual partners.  Talk with your sexual partner about any symptoms that either of you may have, as well as any history of STIs.  Get tested for STIs and STDs (sexually transmitted diseases) before you have sex. Ask your partner  to do the same.  Do not have sexual contact if you have symptoms of trichomoniasis or another STI. Contact a health care provider if:  You still have symptoms after you finish your  medicine.  You develop pain in your abdomen.  You have pain when you urinate.  You have bleeding after sexual intercourse.  You develop a rash.  You feel nauseous or you vomit.  You plan to become pregnant or think you may be pregnant. Summary  Trichomoniasis is an STI (sexually transmitted infection) that can affect both women and men.  This condition often has no symptoms (is asymptomatic), especially in men.  You should not have sexual intercourse until one week after you finish your medicine, or until your health care provider approves. Ask your health care provider when you may resume sexual intercourse.  Discuss your infection with your sexual partner. Make sure that your partner gets tested and treated, if necessary. This information is not intended to replace advice given to you by your health care provider. Make sure you discuss any questions you have with your health care provider. Document Released: 03/10/2001 Document Revised: 08/07/2016 Document Reviewed: 08/07/2016 Elsevier Interactive Patient Education  2017 [employer] Genital Herpes Genital herpes is a common sexually transmitted infection (STI) that is caused by a virus. The virus spreads from person to person through sexual contact. Infection can cause itching, blisters, and sores around the genitals or rectum. Symptoms may last several days and then go away This is called an outbreak. However, the virus remains in your body, so you may have more outbreaks in the future. The time between outbreaks varies and can be months or years. Genital herpes affects men and women. It is particularly concerning for pregnant women because the virus can be passed to the baby during delivery and can cause serious problems. Genital herpes is also a concern for people who have a weak disease-fighting (immune) system. What are the causes? This condition is caused by the herpes simplex virus (HSV) type 1 or type 2. The virus may spread  through:  Sexual contact with an infected person, including vaginal, anal, and oral sex.  Contact with fluid from a herpes sore.  The skin. This means that you can get herpes from an infected partner even if he or she does not have a visible sore or does not know that he or she is infected.  What increases the risk? You are more likely to develop this condition if:  You have sex with many partners.  You do not use latex condoms during sex.  What are the signs or symptoms? Most people do not have symptoms (asymptomatic) or have mild symptoms that may be mistaken for other skin problems. Symptoms may include:  Small red bumps near the genitals, rectum, or mouth. These bumps turn into blisters and then turn into sores.  Flu-like symptoms, including: ? Fever. ? Body aches. ? Swollen lymph nodes. ? Headache.  Painful urination.  Pain and itching in the genital area or rectal area.  Vaginal discharge.  Tingling or shooting pain in the legs and buttocks.  Generally, symptoms are more severe and last longer during the first (primary) outbreak. Flu-like symptoms are also more common during the primary outbreak. How is this diagnosed? Genital herpes may be diagnosed based on:  A physical exam.  Your medical history.  Blood tests.  A test of a fluid sample (culture) from an open sore.  How is this treated? There is no cure  for this condition, but treatment with antiviral medicines that are taken by mouth (orally) can do the following: °· Speed up healing and relieve symptoms. °· Help to reduce the spread of the virus to sexual partners. °· Limit the chance of future outbreaks, or make future outbreaks shorter. °· Lessen symptoms of future outbreaks. ° °Your health care provider may also recommend pain relief medicines, such as aspirin or ibuprofen. °Follow these instructions at home: °Sexual activity °· Do not have sexual contact during active outbreaks. °· Practice safe sex.  Latex condoms and female condoms may help prevent the spread of the herpes virus. °General instructions °· Keep the affected areas dry and clean. °· Take over-the-counter and prescription medicines only as told by your health care provider. °· Avoid rubbing or touching blisters and sores. If you do touch blisters or sores: °? Wash your hands thoroughly with soap and water. °? Do not touch your eyes afterward. °· To help relieve pain or itching, you may take the following actions as directed by your health care provider: °? Apply a cold, wet cloth (cold compress) to affected areas 4-6 times a day. °? Apply a substance that protects your skin and reduces bleeding (astringent). °? Apply a gel that helps relieve pain around sores (lidocaine gel). °? Take a warm, shallow bath that cleans the genital area (sitz bath). °· Keep all follow-up visits as told by your health care provider. This is important. °How is this prevented? °· Use condoms. Although anyone can get genital herpes during sexual contact, even with the use of a condom, a condom can provide some protection. °· Avoid having multiple sexual partners. °· Talk with your sexual partner about any symptoms either of you may have. Also, talk with your partner about any history of STIs. °· Get tested for STIs before you have sex. Ask your partner to do the same. °· Do not have sexual contact if you have symptoms of genital herpes. °Contact a health care provider if: °· Your symptoms are not improving with medicine. °· Your symptoms return. °· You have new symptoms. °· You have a fever. °· You have abdominal pain. °· You have redness, swelling, or pain in your eye. °· You notice new sores on other parts of your body. °· You are a woman and experience bleeding between menstrual periods. °· You have had herpes and you become pregnant or plan to become pregnant. °Summary °· Genital herpes is a common sexually transmitted infection (STI) that is caused by the herpes  simplex virus (HSV) type 1 or type 2. °· These viruses are most often spread through sexual contact with an infected person. °· You are more likely to develop this condition if you have sex with many partners or you have unprotected sex. °· Most people do not have symptoms (asymptomatic) or have mild symptoms that may be mistaken for other skin problems. Symptoms occur as outbreaks that may happen months or years apart. °· There is no cure for this condition, but treatment with oral antiviral medicines can reduce symptoms, reduce the chance of spreading the virus to a partner, prevent future outbreaks, or shorten future outbreaks. °This information is not intended to replace advice given to you by your health care provider. Make sure you discuss any questions you have with your health care provider. °Document Released: 09/11/2000 Document Revised: 08/14/2016 Document Reviewed: 08/14/2016 °Elsevier Interactive Patient Education © 2017 Elsevier Inc. ° °

## 2017-03-08 NOTE — Progress Notes (Signed)
Hematology and Oncology Follow Up Visit  Leslie Gibson [id number] 10-28-1975 41 y.o. 03/08/2017   Principle Diagnosis:  Iron deficeincy anemia secondary to menorrhagia   Current Therapy:   IV iron as indicated - last received March 2018   Interim History:  Ms. Leslie Gibson is here today for follow-up. She is having some fatigue, SOB with over exertion, dizziness and tingling in her toes that comes and goes.  She states that she is having bad migraines off and on. She states that these started after she began taking her current birth control and follows up with gynecology later today for this. She sees her PCP this week on Wednesday as well.  Her cycles are regular but remain heavy. Iron studies for today are pending. Hgb is stable 12.1 with an MCV of 84. Platelet count is 257.  No other episodes of bleeding. No bruising or petechiae.  She is exercising and staying well hydrated. Her appetite comes and goes. Her weight is stable.  No fever, chills, n/v, cough, rash, chest pain, palpitations, abdominal pain or changes in bowel or bladder habits.  She has had no falls or syncopal episodes.  No lymphadenopathy found on exam.  No swelling, tenderness, numbness or tingling in her extremities at this time. No c/o pain.   ECOG Performance Status: 1 - Symptomatic but completely ambulatory  Medications:  Allergies as of 03/08/2017      Reactions   Bactrim [sulfamethoxazole-trimethoprim] Shortness Of Breath   Ciprofloxacin    Fish Allergy    Peanut-containing Drug Products    Strawberry (diagnostic)    Tylenol [acetaminophen]       Medication List       Accurate as of 03/08/17 11:53 AM. Always use your most recent med list.          betamethasone valerate ointment 0.1 % Commonly known as:  VALISONE Apply a pea sized amount topically BID for 1-2 weeks as need for itching   EPIPEN IJ Inject as directed.   metroNIDAZOLE 500 MG tablet Commonly known as:  FLAGYL Take 1 tablet  (500 mg total) by mouth 2 (two) times daily. X 5 days   metroNIDAZOLE 500 MG tablet Commonly known as:  FLAGYL Take 1 tablet (500 mg total) by mouth 2 (two) times daily.   naproxen sodium 550 MG tablet Commonly known as:  ANAPROX DS Take 1 tablet (550 mg total) by mouth 2 (two) times daily with a meal.   norethindrone-ethinyl estradiol 1-20 MG-MCG tablet Commonly known as:  JUNEL FE,GILDESS FE,LOESTRIN FE Take 1 tablet by mouth daily.   PRESCRIPTION MEDICATION Taking Infusions for iron   promethazine 12.5 MG tablet Commonly known as:  PHENERGAN Take 1-2 tablets po q 4 hours prn nausea   Vitamin D (Ergocalciferol) 50000 units Caps capsule Commonly known as:  DRISDOL Take 1 capsule (50,000 Units total) by mouth every 7 (seven) days.       Allergies:  Allergies  Allergen Reactions  . Bactrim [Sulfamethoxazole-Trimethoprim] Shortness Of Breath  . Ciprofloxacin   . Fish Allergy   . Peanut-Containing Drug Products   . Strawberry (Diagnostic)   . Tylenol [Acetaminophen]     Past Medical History, Surgical history, Social history, and Family History were reviewed and updated.  Review of Systems: All other 10 point review of systems is negative.   Physical Exam:  weight is 184 lb (83.5 kg). Her oral temperature is 98.7 F (37.1 C). Her blood pressure is 124/73 and her pulse is 64. Her respiration is  17 and oxygen saturation is 100%.   Wt Readings from Last 3 Encounters:  03/08/17 184 lb (83.5 kg)  02/10/17 183 lb (83 kg)  02/08/17 183 lb (83 kg)    Ocular: Sclerae unicteric, pupils equal, round and reactive to light Ear-nose-throat: Oropharynx clear, dentition fair Lymphatic: No cervical, supraclavicular or axillary adenopathy Lungs no rales or rhonchi, good excursion bilaterally Heart regular rate and rhythm, no murmur appreciated Abd soft, nontender, positive bowel sounds, no liver or spleen tip palpated on exam, no fluid wave MSK no focal spinal tenderness, no  joint edema Neuro: non-focal, well-oriented, appropriate affect Breasts: Deferred   Lab Results  Component Value Date   WBC 4.2 03/08/2017   HGB 12.1 03/08/2017   HCT 36.8 03/08/2017   MCV 84.2 03/08/2017   PLT 257 03/08/2017   Lab Results  Component Value Date   FERRITIN 36 01/07/2017   IRON 65 01/07/2017   TIBC 271 01/07/2017   UIBC 207 01/07/2017   IRONPCTSAT 24 01/07/2017   Lab Results  Component Value Date   RBC 4.37 03/08/2017   No results found for: KPAFRELGTCHN, LAMBDASER, KAPLAMBRATIO No results found for: IGGSERUM, IGA, IGMSERUM No results found for: Dorene ArOTALPROTELP, ALBUMINELP, A1GS, Emmit Alexanders2GS, BETS, BETA2SER, GAMS, MSPIKE, SPEI   Chemistry      Component Value Date/Time   NA 138 11/19/2016 0940   K 3.9 11/19/2016 0940   CL 103 09/07/2016 1730   CO2 21 (L) 11/19/2016 0940   BUN 12.7 11/19/2016 0940   CREATININE 0.8 11/19/2016 0940      Component Value Date/Time   CALCIUM 9.0 11/19/2016 0940   ALKPHOS 90 11/19/2016 0940   AST 14 11/19/2016 0940   ALT 11 11/19/2016 0940   BILITOT 0.22 11/19/2016 0940      Impression and Plan: Ms. Leslie Gibson is a very pleasant 41 yo African American female with iron deficiency anemia secondary to menorrhagia. She is symptomatic with fatigue, dizziness, SOB with over exertion and tingling in her toes that comes and goes. Her last does of IV iron was in March. Her cycles continue to be quite heavy.  She is following up with her gynecologist and PCP regarding her migraines.  We will see what her iron studies show and bring her back in later this week for an infusion if needed.  We will go ahead and plan to see her back in 2 months for repeat lab work and follow-up.  She verbalized that she will contact our office with any questions or concerns. We can certainly see her sooner if need be.   Verdie Mosher[place]INNAT[place],Berton Butrick M, NP 6/11/201811:53 AM\

## 2017-03-09 ENCOUNTER — Ambulatory Visit (HOSPITAL_BASED_OUTPATIENT_CLINIC_OR_DEPARTMENT_OTHER): Payer: BLUE CROSS/BLUE SHIELD

## 2017-03-09 VITALS — BP 126/74 | HR 60 | Temp 98.0°F

## 2017-03-09 DIAGNOSIS — D500 Iron deficiency anemia secondary to blood loss (chronic): Secondary | ICD-10-CM | POA: Diagnosis not present

## 2017-03-09 DIAGNOSIS — N920 Excessive and frequent menstruation with regular cycle: Secondary | ICD-10-CM | POA: Diagnosis not present

## 2017-03-09 LAB — VAGINITIS/VAGINOSIS, DNA PROBE
CANDIDA SPECIES: NEGATIVE
GARDNERELLA VAGINALIS: POSITIVE — AB
Trichomonas vaginosis: NEGATIVE

## 2017-03-09 MED ORDER — SODIUM CHLORIDE 0.9 % IV SOLN
510.0000 mg | Freq: Once | INTRAVENOUS | Status: AC
  Administered 2017-03-09: 510 mg via INTRAVENOUS
  Filled 2017-03-09: qty 17

## 2017-03-09 NOTE — Patient Instructions (Signed)
Anemia, Nonspecific Anemia is a condition in which the concentration of red blood cells or hemoglobin in the blood is below normal. Hemoglobin is a substance in red blood cells that carries oxygen to the tissues of the body. Anemia results in not enough oxygen reaching these tissues. What are the causes? Common causes of anemia include:  Excessive bleeding. Bleeding may be internal or external. This includes excessive bleeding from periods (in women) or from the intestine.  Poor nutrition.  Chronic kidney, thyroid, and liver disease.  Bone marrow disorders that decrease red blood cell production.  Cancer and treatments for cancer.  HIV, AIDS, and their treatments.  Spleen problems that increase red blood cell destruction.  Blood disorders.  Excess destruction of red blood cells due to infection, medicines, and autoimmune disorders. What are the signs or symptoms?  Minor weakness.  Dizziness.  Headache.  Palpitations.  Shortness of breath, especially with exercise.  Paleness.  Cold sensitivity.  Indigestion.  Nausea.  Difficulty sleeping.  Difficulty concentrating. Symptoms may occur suddenly or they may develop slowly. How is this diagnosed? Additional blood tests are often needed. These help your health care provider determine the best treatment. Your health care provider will check your stool for blood and look for other causes of blood loss. How is this treated? Treatment varies depending on the cause of the anemia. Treatment can include:  Supplements of iron, vitamin B12, or folic acid.  Hormone medicines.  A blood transfusion. This may be needed if blood loss is severe.  Hospitalization. This may be needed if there is significant continual blood loss.  Dietary changes.  Spleen removal. Follow these instructions at home: Keep all follow-up appointments. It often takes many weeks to correct anemia, and having your health care provider check on your  condition and your response to treatment is very important. Get help right away if:  You develop extreme weakness, shortness of breath, or chest pain.  You become dizzy or have trouble concentrating.  You develop heavy vaginal bleeding.  You develop a rash.  You have bloody or black, tarry stools.  You faint.  You vomit up blood.  You vomit repeatedly.  You have abdominal pain.  You have a fever or persistent symptoms for more than 2-3 days.  You have a fever and your symptoms suddenly get worse.  You are dehydrated. This information is not intended to replace advice given to you by your health care provider. Make sure you discuss any questions you have with your health care provider. Document Released: 10/22/2004 Document Revised: 02/26/2016 Document Reviewed: 03/10/2013 Elsevier Interactive Patient Education  2017 Elsevier Inc.  

## 2017-03-10 ENCOUNTER — Ambulatory Visit: Payer: Self-pay | Admitting: Obstetrics and Gynecology

## 2017-03-10 ENCOUNTER — Telehealth: Payer: Self-pay | Admitting: *Deleted

## 2017-03-10 ENCOUNTER — Telehealth: Payer: Self-pay | Admitting: Obstetrics and Gynecology

## 2017-03-10 ENCOUNTER — Ambulatory Visit (INDEPENDENT_AMBULATORY_CARE_PROVIDER_SITE_OTHER): Payer: BLUE CROSS/BLUE SHIELD | Admitting: Family Medicine

## 2017-03-10 VITALS — BP 112/65 | HR 95 | Temp 99.1°F | Resp 16 | Ht 68.0 in | Wt 180.8 lb

## 2017-03-10 DIAGNOSIS — R42 Dizziness and giddiness: Secondary | ICD-10-CM

## 2017-03-10 DIAGNOSIS — F4323 Adjustment disorder with mixed anxiety and depressed mood: Secondary | ICD-10-CM

## 2017-03-10 DIAGNOSIS — E611 Iron deficiency: Secondary | ICD-10-CM

## 2017-03-10 DIAGNOSIS — N924 Excessive bleeding in the premenopausal period: Secondary | ICD-10-CM

## 2017-03-10 DIAGNOSIS — R894 Abnormal immunological findings in specimens from other organs, systems and tissues: Secondary | ICD-10-CM

## 2017-03-10 MED ORDER — METRONIDAZOLE 500 MG PO TABS
500.0000 mg | ORAL_TABLET | Freq: Two times a day (BID) | ORAL | 0 refills | Status: DC
Start: 2017-03-10 — End: 2017-07-06

## 2017-03-10 NOTE — Telephone Encounter (Signed)
-----   Message from Romualdo BolkJill Evelyn Jertson, MD sent at 03/09/2017  7:44 PM EDT ----- Please inform the patient that her vaginitis probe was + for BV and treat with flagyl (either oral or vaginal, her choice), no ETOH while on Flagyl.  Oral: Flagyl 500 mg BID x 7 days, or Vaginal: Metrogel, 1 applicator per vagina q day x 5 days.

## 2017-03-10 NOTE — Telephone Encounter (Signed)
Patient canceled her approximant today at 4:15pm to discuss results. Patient had scheduled this appointment today. Patient said she could not make it today and rescheduled to tomorrow at 1:00pm.

## 2017-03-10 NOTE — Patient Instructions (Signed)
     IF you received an x-ray today, you will receive an invoice from [facility]. Please contact [facility] at [phone] with questions or concerns regarding your invoice.   IF you received labwork today, you will receive an invoice from [facility]. Please contact [facility] at [phone] with questions or concerns regarding your invoice.   Our billing staff will not be able to assist you with questions regarding bills from these companies.  You will be contacted with the lab results as soon as they are available. The fastest way to get your results is to activate your My Chart account. Instructions are located on the last page of this paperwork. If you have not heard from us regarding the results in 2 weeks, please contact this office.     

## 2017-03-10 NOTE — Telephone Encounter (Signed)
Spoke with patient and went over results. Patient became very upset on the phone with questions regarding her HSV. She requested to come in and talk with Dr. Oscar LaJertson today to better understand.  RX for Flagyl sent in and advised patient to avoid alcholol while taking. Patient scheduled today for a consult -eh

## 2017-03-11 ENCOUNTER — Ambulatory Visit (INDEPENDENT_AMBULATORY_CARE_PROVIDER_SITE_OTHER): Payer: BLUE CROSS/BLUE SHIELD | Admitting: Obstetrics and Gynecology

## 2017-03-11 ENCOUNTER — Telehealth: Payer: Self-pay | Admitting: Family Medicine

## 2017-03-11 ENCOUNTER — Encounter: Payer: Self-pay | Admitting: Obstetrics and Gynecology

## 2017-03-11 VITALS — BP 110/60 | HR 60 | Resp 16 | Ht 68.0 in | Wt 183.0 lb

## 2017-03-11 DIAGNOSIS — Z862 Personal history of diseases of the blood and blood-forming organs and certain disorders involving the immune mechanism: Secondary | ICD-10-CM

## 2017-03-11 DIAGNOSIS — B009 Herpesviral infection, unspecified: Secondary | ICD-10-CM | POA: Diagnosis not present

## 2017-03-11 DIAGNOSIS — N920 Excessive and frequent menstruation with regular cycle: Secondary | ICD-10-CM

## 2017-03-11 NOTE — Telephone Encounter (Signed)
Disregard this message and the paperwork in your box I got the copy that was already placed in the FMLA box this afternoon so you can return the blank forms in your box back to me.  Thank you

## 2017-03-11 NOTE — Telephone Encounter (Signed)
Patient was seen on 03/10/17 for her dizziness, I need forms completed for the [facility] for her disability claim. I was going to try and fill forms out but the OV note was not completed in the chart so I will place the blank forms in Dr Tyson AliasWalden's box on 03/11/17 please return to the FMLA/Disability box at the 102 checkout desk within 5-7 business day. Thank you!
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
## 2017-03-12 NOTE — Telephone Encounter (Signed)
Ok thanks 

## 2017-03-18 ENCOUNTER — Ambulatory Visit: Payer: Medicare Other

## 2017-03-22 ENCOUNTER — Telehealth: Payer: Self-pay | Admitting: Family Medicine

## 2017-03-22 NOTE — Telephone Encounter (Signed)
Disability forms to be updated based off patients last OV with Dr Gwendolyn GrantWalden I have completed what I could from the OV notes and highlighted the areas I was not sure about. I will place the form in the box of the provider covering Dr Tyson AliasWalden's box on 03/22/17 please return them to the FMLA/Disability box at the 102 checkout desk within 5-7 business days. Thank you!

## 2017-03-24 ENCOUNTER — Telehealth: Payer: Self-pay | Admitting: Obstetrics and Gynecology

## 2017-03-24 ENCOUNTER — Ambulatory Visit: Payer: Medicare Other

## 2017-03-24 NOTE — Telephone Encounter (Signed)
Can you send her a letter explaining it please. Then close the encounter.

## 2017-03-24 NOTE — Telephone Encounter (Signed)
Placed call to patient to review benefits for a Mirena IUD insertion. First call someone answered, then call was "lost" or disconnected. Placed a second call, there was no answer and I was unable to leave a voicemail, due to mailbox being full.    cc: Dr Oscar LaJertson

## 2017-03-26 ENCOUNTER — Ambulatory Visit (HOSPITAL_BASED_OUTPATIENT_CLINIC_OR_DEPARTMENT_OTHER): Payer: BLUE CROSS/BLUE SHIELD

## 2017-03-26 ENCOUNTER — Ambulatory Visit (INDEPENDENT_AMBULATORY_CARE_PROVIDER_SITE_OTHER): Payer: BLUE CROSS/BLUE SHIELD | Admitting: Clinical

## 2017-03-26 VITALS — BP 109/57 | HR 64 | Temp 99.1°F | Resp 17

## 2017-03-26 DIAGNOSIS — D500 Iron deficiency anemia secondary to blood loss (chronic): Secondary | ICD-10-CM

## 2017-03-26 DIAGNOSIS — F3173 Bipolar disorder, in partial remission, most recent episode manic: Secondary | ICD-10-CM | POA: Diagnosis not present

## 2017-03-26 DIAGNOSIS — N920 Excessive and frequent menstruation with regular cycle: Secondary | ICD-10-CM | POA: Diagnosis not present

## 2017-03-26 MED ORDER — SODIUM CHLORIDE 0.9 % IV SOLN
510.0000 mg | Freq: Once | INTRAVENOUS | Status: AC
  Administered 2017-03-26: 510 mg via INTRAVENOUS
  Filled 2017-03-26: qty 17

## 2017-03-26 NOTE — Patient Instructions (Signed)

## 2017-03-29 ENCOUNTER — Ambulatory Visit (INDEPENDENT_AMBULATORY_CARE_PROVIDER_SITE_OTHER): Payer: Medicare Other | Admitting: Clinical

## 2017-03-29 DIAGNOSIS — F3173 Bipolar disorder, in partial remission, most recent episode manic: Secondary | ICD-10-CM

## 2017-04-01 NOTE — Telephone Encounter (Signed)
Have we completed these forms today is the 8th business day. Thank you !!

## 2017-04-02 NOTE — Telephone Encounter (Signed)
Forms placed in FMLA box 

## 2017-04-05 NOTE — Telephone Encounter (Signed)
Paperwork scanned and faxed to [employer] on 04/05/17

## 2017-04-07 ENCOUNTER — Encounter: Payer: Self-pay | Admitting: Emergency Medicine

## 2017-04-07 ENCOUNTER — Ambulatory Visit (INDEPENDENT_AMBULATORY_CARE_PROVIDER_SITE_OTHER): Payer: BLUE CROSS/BLUE SHIELD | Admitting: Emergency Medicine

## 2017-04-07 VITALS — BP 121/66 | HR 67 | Temp 98.5°F | Resp 16 | Ht 68.0 in | Wt 182.8 lb

## 2017-04-07 DIAGNOSIS — N920 Excessive and frequent menstruation with regular cycle: Secondary | ICD-10-CM | POA: Diagnosis not present

## 2017-04-07 DIAGNOSIS — D649 Anemia, unspecified: Secondary | ICD-10-CM | POA: Diagnosis not present

## 2017-04-07 DIAGNOSIS — D500 Iron deficiency anemia secondary to blood loss (chronic): Secondary | ICD-10-CM

## 2017-04-07 DIAGNOSIS — F4323 Adjustment disorder with mixed anxiety and depressed mood: Secondary | ICD-10-CM | POA: Diagnosis not present

## 2017-04-07 DIAGNOSIS — R531 Weakness: Secondary | ICD-10-CM | POA: Diagnosis not present

## 2017-04-07 NOTE — Assessment & Plan Note (Signed)
Chronic anemia causing chronic symptoms requiring periodical iron infusions. Has DOE, easy fatigue, trouble concentrating, and limited tolerance to streneous activities.

## 2017-04-07 NOTE — Patient Instructions (Signed)
Anemia, Nonspecific Anemia is a condition in which the concentration of red blood cells or hemoglobin in the blood is below normal. Hemoglobin is a substance in red blood cells that carries oxygen to the tissues of the body. Anemia results in not enough oxygen reaching these tissues. What are the causes? Common causes of anemia include:  Excessive bleeding. Bleeding may be internal or external. This includes excessive bleeding from periods (in women) or from the intestine.  Poor nutrition.  Chronic kidney, thyroid, and liver disease.  Bone marrow disorders that decrease red blood cell production.  Cancer and treatments for cancer.  HIV, AIDS, and their treatments.  Spleen problems that increase red blood cell destruction.  Blood disorders.  Excess destruction of red blood cells due to infection, medicines, and autoimmune disorders. What are the signs or symptoms?  Minor weakness.  Dizziness.  Headache.  Palpitations.  Shortness of breath, especially with exercise.  Paleness.  Cold sensitivity.  Indigestion.  Nausea.  Difficulty sleeping.  Difficulty concentrating. Symptoms may occur suddenly or they may develop slowly. How is this diagnosed? Additional blood tests are often needed. These help your health care provider determine the best treatment. Your health care provider will check your stool for blood and look for other causes of blood loss. How is this treated? Treatment varies depending on the cause of the anemia. Treatment can include:  Supplements of iron, vitamin B12, or folic acid.  Hormone medicines.  A blood transfusion. This may be needed if blood loss is severe.  Hospitalization. This may be needed if there is significant continual blood loss.  Dietary changes.  Spleen removal. Follow these instructions at home: Keep all follow-up appointments. It often takes many weeks to correct anemia, and having your health care provider check on your  condition and your response to treatment is very important. Get help right away if:  You develop extreme weakness, shortness of breath, or chest pain.  You become dizzy or have trouble concentrating.  You develop heavy vaginal bleeding.  You develop a rash.  You have bloody or black, tarry stools.  You faint.  You vomit up blood.  You vomit repeatedly.  You have abdominal pain.  You have a fever or persistent symptoms for more than 2-3 days.  You have a fever and your symptoms suddenly get worse.  You are dehydrated. This information is not intended to replace advice given to you by your health care provider. Make sure you discuss any questions you have with your health care provider. Document Released: 10/22/2004 Document Revised: 02/26/2016 Document Reviewed: 03/10/2013 Elsevier Interactive Patient Education  2017 Elsevier Inc.  

## 2017-04-07 NOTE — Assessment & Plan Note (Signed)
Chronic and under care of GYNMD.
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
## 2017-04-09 ENCOUNTER — Telehealth: Payer: Self-pay | Admitting: Emergency Medicine

## 2017-04-09 NOTE — Telephone Encounter (Signed)
Patient already has short term disability with restrictions but I just got a blank form for long term disability restrictions, I was not sure how to complete this form based off her last OV with Dr Alvy BimlerSagardia. I will place the blank form in your box on 04/09/17 please return them to the FMLA/Disability box at the 102 checkout desk within 5-7 business days. Thank you!  I will also include the letter that was written at her last OV for her insurance when I fax the forms back.

## 2017-04-10 NOTE — Telephone Encounter (Signed)
I discussed this form with patient during the last visit. Will address next visit after she gets Physical Medicine evaluation. Patient aware I need more information before I can complete this form as I just picked up her case from Dr. Gwendolyn GrantWalden.

## 2017-04-19 ENCOUNTER — Telehealth: Payer: Self-pay | Admitting: Physical Medicine & Rehabilitation

## 2017-04-19 NOTE — Telephone Encounter (Signed)
Patient called out office requesting we fill out papers - advised we cannot complete as we have not even accepted her for referral as of yet - 4-5 weeks for review just received 7;11.18- [facility] at [facility] [place] voicemail advising cannot assist at this point.

## 2017-04-26 ENCOUNTER — Ambulatory Visit (INDEPENDENT_AMBULATORY_CARE_PROVIDER_SITE_OTHER): Payer: Medicare Other | Admitting: Clinical

## 2017-04-26 DIAGNOSIS — F3173 Bipolar disorder, in partial remission, most recent episode manic: Secondary | ICD-10-CM | POA: Diagnosis not present

## 2017-05-06 ENCOUNTER — Ambulatory Visit (INDEPENDENT_AMBULATORY_CARE_PROVIDER_SITE_OTHER): Payer: BLUE CROSS/BLUE SHIELD | Admitting: Emergency Medicine

## 2017-05-06 ENCOUNTER — Encounter: Payer: Self-pay | Admitting: Emergency Medicine

## 2017-05-06 ENCOUNTER — Telehealth: Payer: Self-pay | Admitting: *Deleted

## 2017-05-06 VITALS — BP 98/58 | HR 67 | Temp 98.9°F | Resp 16 | Ht 68.75 in | Wt 180.0 lb

## 2017-05-06 DIAGNOSIS — R531 Weakness: Secondary | ICD-10-CM | POA: Insufficient documentation

## 2017-05-06 DIAGNOSIS — F4323 Adjustment disorder with mixed anxiety and depressed mood: Secondary | ICD-10-CM | POA: Diagnosis not present

## 2017-05-06 DIAGNOSIS — D649 Anemia, unspecified: Secondary | ICD-10-CM | POA: Diagnosis not present
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
## 2017-05-06 NOTE — Telephone Encounter (Signed)
Faxed completed forms ATTN: Amy Bunkley at [employer]. Confirmation page received at 4:01 pm. I copied the forms to [facility], patient requested the original forms to fax to [employer].

## 2017-05-06 NOTE — Patient Instructions (Addendum)
     IF you received an x-ray today, you will receive an invoice from [facility]. Please contact [facility] at [phone] with questions or concerns regarding your invoice.   IF you received labwork today, you will receive an invoice from [facility]. Please contact [facility] at [phone] with questions or concerns regarding your invoice.   Our billing staff will not be able to assist you with questions regarding bills from these companies.  You will be contacted with the lab results as soon as they are available. The fastest way to get your results is to activate your My Chart account. Instructions are located on the last page of this paperwork. If you have not heard from us regarding the results in 2 weeks, please contact this office.    Anemia, Nonspecific Anemia is a condition in which the concentration of red blood cells or hemoglobin in the blood is below normal. Hemoglobin is a substance in red blood cells that carries oxygen to the tissues of the body. Anemia results in not enough oxygen reaching these tissues. What are the causes? Common causes of anemia include:  Excessive bleeding. Bleeding may be internal or external. This includes excessive bleeding from periods (in women) or from the intestine.  Poor nutrition.  Chronic kidney, thyroid, and liver disease.  Bone marrow disorders that decrease red blood cell production.  Cancer and treatments for cancer.  HIV, AIDS, and their treatments.  Spleen problems that increase red blood cell destruction.  Blood disorders.  Excess destruction of red blood cells due to infection, medicines, and autoimmune disorders.  What are the signs or symptoms?  Minor weakness.  Dizziness.  Headache.  Palpitations.  Shortness of breath, especially with exercise.  Paleness.  Cold sensitivity.  Indigestion.  Nausea.  Difficulty sleeping.  Difficulty concentrating. Symptoms may occur suddenly or they  may develop slowly. How is this diagnosed? Additional blood tests are often needed. These help your health care provider determine the best treatment. Your health care provider will check your stool for blood and look for other causes of blood loss. How is this treated? Treatment varies depending on the cause of the anemia. Treatment can include:  Supplements of iron, vitamin B12, or folic acid.  Hormone medicines.  A blood transfusion. This may be needed if blood loss is severe.  Hospitalization. This may be needed if there is significant continual blood loss.  Dietary changes.  Spleen removal.  Follow these instructions at home: Keep all follow-up appointments. It often takes many weeks to correct anemia, and having your health care provider check on your condition and your response to treatment is very important. Get help right away if:  You develop extreme weakness, shortness of breath, or chest pain.  You become dizzy or have trouble concentrating.  You develop heavy vaginal bleeding.  You develop a rash.  You have bloody or black, tarry stools.  You faint.  You vomit up blood.  You vomit repeatedly.  You have abdominal pain.  You have a fever or persistent symptoms for more than 2-3 days.  You have a fever and your symptoms suddenly get worse.  You are dehydrated. This information is not intended to replace advice given to you by your health care provider. Make sure you discuss any questions you have with your health care provider. Document Released: 10/22/2004 Document Revised: 02/26/2016 Document Reviewed: 03/10/2013 Elsevier Interactive Patient Education  2017 Elsevier Inc.  

## 2017-05-10 ENCOUNTER — Ambulatory Visit: Payer: Medicare Other | Admitting: Family

## 2017-05-10 ENCOUNTER — Ambulatory Visit (INDEPENDENT_AMBULATORY_CARE_PROVIDER_SITE_OTHER): Payer: Medicare Other | Admitting: Clinical

## 2017-05-10 ENCOUNTER — Other Ambulatory Visit: Payer: Medicare Other

## 2017-05-10 DIAGNOSIS — F3173 Bipolar disorder, in partial remission, most recent episode manic: Secondary | ICD-10-CM

## 2017-05-11 ENCOUNTER — Telehealth: Payer: Self-pay | Admitting: Emergency Medicine

## 2017-05-11 ENCOUNTER — Telehealth: Payer: Self-pay | Admitting: Obstetrics and Gynecology

## 2017-05-11 NOTE — Telephone Encounter (Signed)
Spoke with patient, request to schedule Mirena IUD insertion. LMP 05/08/17.   IUD not scheduled at this time d/t insurance coverage. (See account notes)  Patient request return call for out of pocket cost of Mirena IUD insertion. Advised patient would forward message to office administrator for review and return call, patient is agreeable.  Forwarding to [employer].  Cc: Dr. Oscar LaJertson

## 2017-05-11 NOTE — Telephone Encounter (Signed)
Patient would like to schedule appointment for IUD insertion.

## 2017-05-11 NOTE — Telephone Encounter (Signed)
Call transferred to business office.   Routing to provider for final review. Patient agreeable to disposition. Will close encounter.

## 2017-05-11 NOTE — Telephone Encounter (Signed)
Pt walked in stating that the [employer] is needing labworks released to them to close her case.  The release should be in your box.  She states that the release needs to be done ASAP.  I did advise her that I wasn't sure what your schedule is like but that you'll get to it as soon as you can.

## 2017-05-12 NOTE — Telephone Encounter (Signed)
[employer] faxed over more forms to be completed by Dr Alvy BimlerSagardia. I do not feel comfortable completing them because it is a lot of clinical information that is needed. I will place the forms in your box on 05/12/17 please return them to the FMLA/Disability box within 5-7 business days. Thank you

## 2017-05-12 NOTE — Telephone Encounter (Signed)
Thank you, Caitlin. I will take care of those.

## 2017-05-13 DIAGNOSIS — D649 Anemia, unspecified: Secondary | ICD-10-CM | POA: Diagnosis not present

## 2017-05-15 IMAGING — DX DG CHEST 2V
2 series · 2 of 2 positions shown · non-contrast
Comparison: None.

CLINICAL DATA: Cough and fever

EXAM:
CHEST  2 VIEW

[w chest pa]
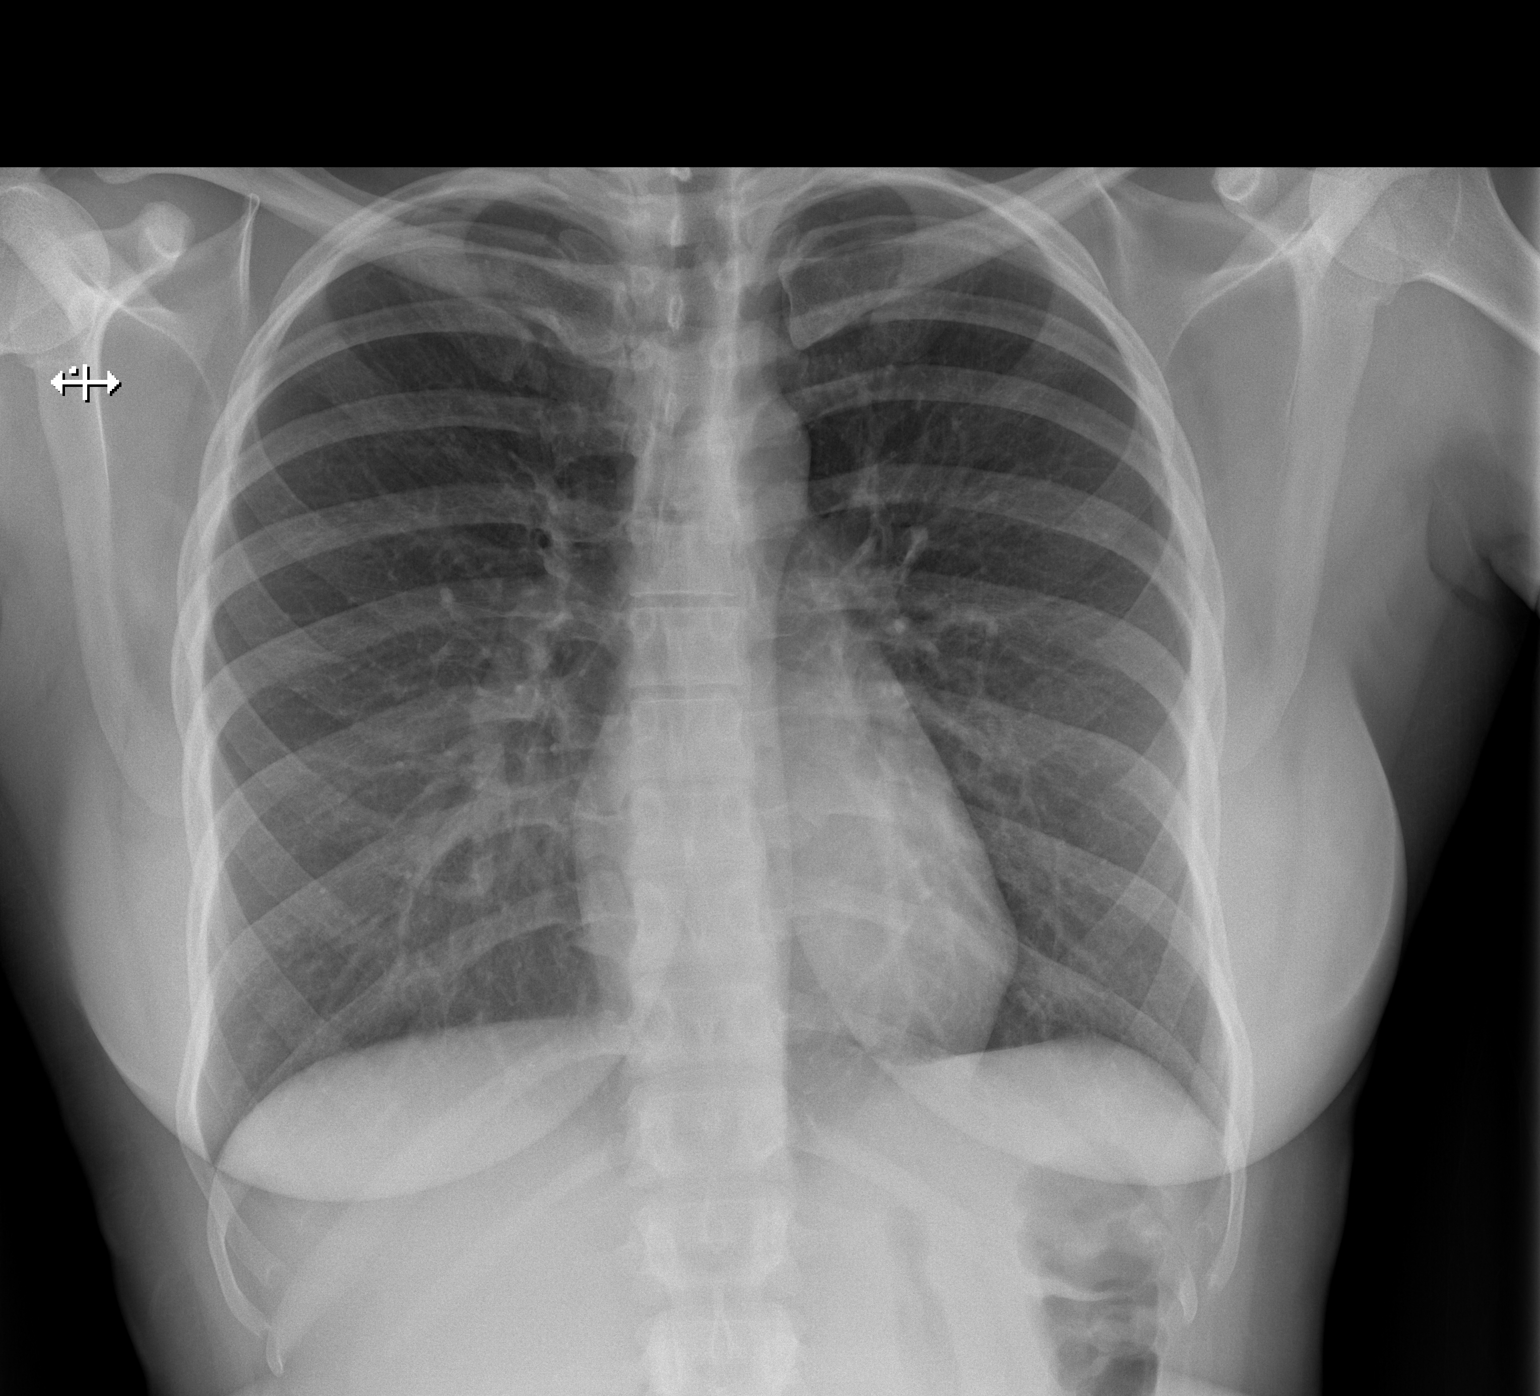

[w chest lat]
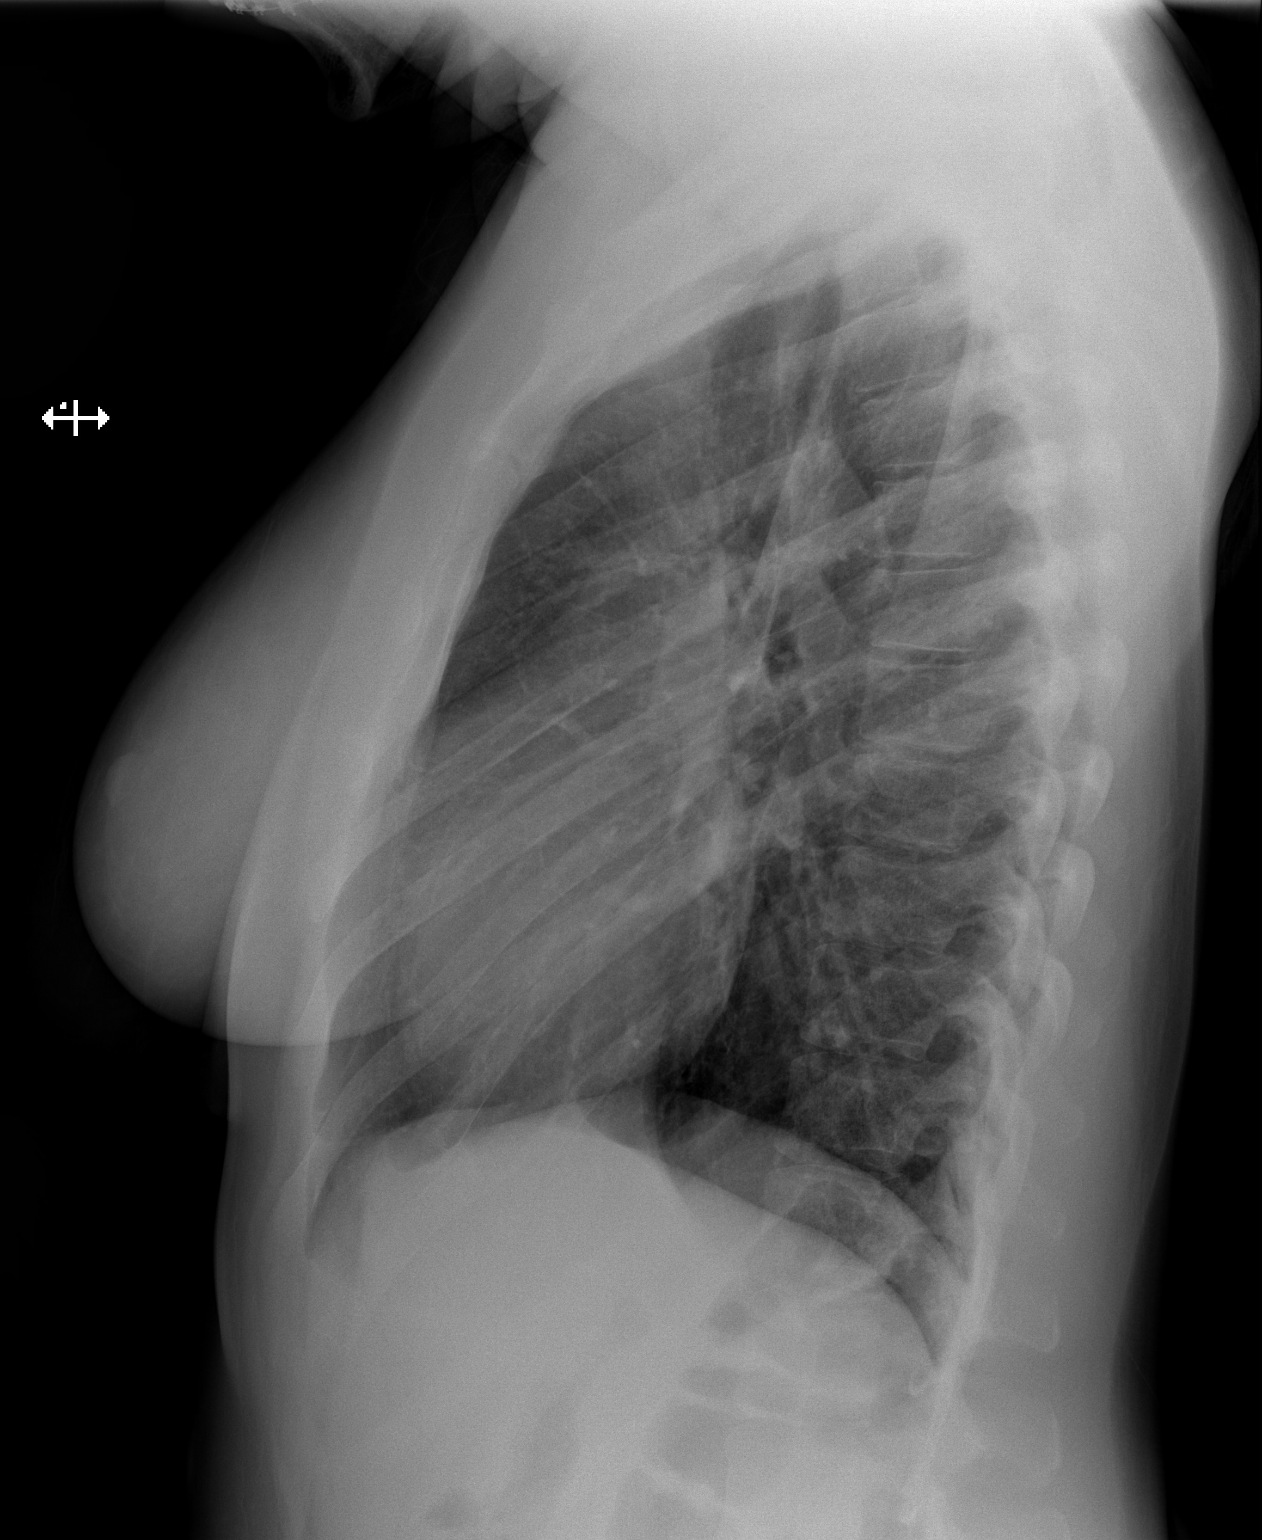

[2 of 2 positions shown; findings below may reference images not displayed]

FINDINGS: Lungs are clear. Heart size and pulmonary vascularity are normal. No
adenopathy. No bone lesions.
IMPRESSION: No edema or consolidation.

## 2017-05-17 NOTE — Telephone Encounter (Signed)
Pt states that the forms have to be done today and faxed in.  She says the case manager told her that her case would be decided without the paperwork if they don't receive it.  She says they sent it over here on the 13th and it should be completed. She also says her job is in [place]. I talked to [place] and he says he will work on it when he can.

## 2017-05-18 NOTE — Telephone Encounter (Signed)
Please advise 

## 2017-05-18 NOTE — Telephone Encounter (Signed)
Will be ready tomorrow.

## 2017-05-19 NOTE — Telephone Encounter (Signed)
Paperwork has been scanned into chart and faxed to [facility] already patient does not need to come get forms unless she needs a copy for herself.

## 2017-05-19 NOTE — Telephone Encounter (Signed)
Attempted to reach patient that form will be ready for pick up tomorrow. Please advise patient if she call back.

## 2017-05-25 ENCOUNTER — Telehealth: Payer: Self-pay | Admitting: Emergency Medicine

## 2017-05-25 NOTE — Telephone Encounter (Signed)
[employer] would like to arrange a telephone meeting with Dr Terence Lux to discuss this patients medical records so they can give a final decision on her disability claim.  They would like for you to call them at [phone] ext [phone] her name is Leslie Gibson she is her case Production designer, theatre/television/film.  Please call her when you can mon - fri 8am - 6pm to discuss her claim.  Thank you

## 2017-05-27 ENCOUNTER — Ambulatory Visit (INDEPENDENT_AMBULATORY_CARE_PROVIDER_SITE_OTHER): Payer: BLUE CROSS/BLUE SHIELD | Admitting: Emergency Medicine

## 2017-05-27 ENCOUNTER — Encounter: Payer: Self-pay | Admitting: Emergency Medicine

## 2017-05-27 ENCOUNTER — Telehealth: Payer: Self-pay | Admitting: *Deleted

## 2017-05-27 VITALS — BP 110/70 | HR 64 | Temp 98.5°F | Resp 16 | Ht 68.5 in | Wt 183.4 lb

## 2017-05-27 DIAGNOSIS — D649 Anemia, unspecified: Secondary | ICD-10-CM | POA: Diagnosis not present

## 2017-05-27 DIAGNOSIS — Z23 Encounter for immunization: Secondary | ICD-10-CM | POA: Insufficient documentation
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
## 2017-05-27 NOTE — Telephone Encounter (Signed)
Faxed letter ATTN: [place]had Martinez([employer] Case Manager), per Dr Alvy BimlerSagardia. Called patient to pick up original copy of faxed letter/confirmation page. Confirmation page received 3:09 pm.

## 2017-05-27 NOTE — Patient Instructions (Signed)
     IF you received an x-ray today, you will receive an invoice from [facility]. Please contact [facility] at [phone] with questions or concerns regarding your invoice.   IF you received labwork today, you will receive an invoice from [facility]. Please contact [facility] at [phone] with questions or concerns regarding your invoice.   Our billing staff will not be able to assist you with questions regarding bills from these companies.  You will be contacted with the lab results as soon as they are available. The fastest way to get your results is to activate your My Chart account. Instructions are located on the last page of this paperwork. If you have not heard from us regarding the results in 2 weeks, please contact this office.     

## 2017-06-03 NOTE — Telephone Encounter (Signed)
Contacted her today. Thanks.

## 2017-06-08 ENCOUNTER — Telehealth: Payer: Self-pay | Admitting: Emergency Medicine

## 2017-06-08 NOTE — Telephone Encounter (Signed)
[employer] called again stating that they had tried to call Dr Irving ShowsMiguel back two times on his personal cell phone but there was no answer, so they have faxed over the peer review of this patients disability claim with the input from the other provider. I will place the forms in your box on 06/08/17.  Please read over the review of the patients disability claim and add any comments you want them to know about in this case and place back in my box before 06/15/17.  Thank you!

## 2017-06-09 NOTE — Telephone Encounter (Signed)
Done

## 2017-06-10 NOTE — Telephone Encounter (Signed)
Paperwork scanned and faxed on 06/10/17

## 2017-06-25 ENCOUNTER — Telehealth: Payer: Self-pay | Admitting: *Deleted

## 2017-06-25 ENCOUNTER — Encounter: Payer: Self-pay | Admitting: Emergency Medicine

## 2017-06-25 ENCOUNTER — Ambulatory Visit (INDEPENDENT_AMBULATORY_CARE_PROVIDER_SITE_OTHER): Payer: BLUE CROSS/BLUE SHIELD | Admitting: Emergency Medicine

## 2017-06-25 VITALS — BP 110/60 | HR 71 | Temp 99.1°F | Resp 16 | Ht 68.5 in | Wt 184.8 lb

## 2017-06-25 DIAGNOSIS — R531 Weakness: Secondary | ICD-10-CM | POA: Diagnosis not present

## 2017-06-25 DIAGNOSIS — I959 Hypotension, unspecified: Secondary | ICD-10-CM | POA: Insufficient documentation

## 2017-06-25 DIAGNOSIS — R319 Hematuria, unspecified: Secondary | ICD-10-CM

## 2017-06-25 DIAGNOSIS — D649 Anemia, unspecified: Secondary | ICD-10-CM

## 2017-06-25 DIAGNOSIS — I9589 Other hypotension: Secondary | ICD-10-CM

## 2017-06-25 LAB — POCT CBC
GRANULOCYTE PERCENT: 71 % (ref 37–80)
HCT, POC: 37.3 % — AB (ref 37.7–47.9)
Hemoglobin: 12.9 g/dL (ref 12.2–16.2)
Lymph, poc: 1.1 (ref 0.6–3.4)
MCH, POC: 30.5 pg (ref 27–31.2)
MCHC: 34.5 g/dL (ref 31.8–35.4)
MCV: 88.3 fL (ref 80–97)
MID (CBC): 0.3 (ref 0–0.9)
MPV: 7.8 fL (ref 0–99.8)
PLATELET COUNT, POC: 245 10*3/uL (ref 142–424)
POC Granulocyte: 3.5 (ref 2–6.9)
POC LYMPH %: 22.9 % (ref 10–50)
POC MID %: 6.1 %M (ref 0–12)
RBC: 4.22 M/uL (ref 4.04–5.48)
RDW, POC: 12.8 %
WBC: 5 10*3/uL (ref 4.6–10.2)

## 2017-06-25 LAB — POCT URINALYSIS DIP (MANUAL ENTRY)
Bilirubin, UA: NEGATIVE
GLUCOSE UA: NEGATIVE mg/dL
Ketones, POC UA: NEGATIVE mg/dL
Leukocytes, UA: NEGATIVE
NITRITE UA: NEGATIVE
Protein Ur, POC: NEGATIVE mg/dL
SPEC GRAV UA: 1.015 (ref 1.010–1.025)
UROBILINOGEN UA: 0.2 U/dL
pH, UA: 8.5 — AB (ref 5.0–8.0)

## 2017-06-25 LAB — POCT URINE PREGNANCY: PREG TEST UR: NEGATIVE

## 2017-06-25 NOTE — Telephone Encounter (Signed)
Faxed letter to [employer] ATTN: AMY and same letter to [employer] ATTN: Williemae Natter. Conformation pages received at 4:01 pm and 4:00 pm.

## 2017-06-25 NOTE — Patient Instructions (Addendum)
     IF you received an x-ray today, you will receive an invoice from [facility]. Please contact [facility] at [phone] with questions or concerns regarding your invoice.   IF you received labwork today, you will receive an invoice from [facility]. Please contact [facility] at [phone] with questions or concerns regarding your invoice.   Our billing staff will not be able to assist you with questions regarding bills from these companies.  You will be contacted with the lab results as soon as they are available. The fastest way to get your results is to activate your My Chart account. Instructions are located on the last page of this paperwork. If you have not heard from us regarding the results in 2 weeks, please contact this office.      Hematuria, Adult Hematuria is blood in your urine. It can be caused by a bladder infection, kidney infection, prostate infection, kidney stone, or cancer of your urinary tract. Infections can usually be treated with medicine, and a kidney stone usually will pass through your urine. If neither of these is the cause of your hematuria, further workup to find out the reason may be needed. It is very important that you tell your health care provider about any blood you see in your urine, even if the blood stops without treatment or happens without causing pain. Blood in your urine that happens and then stops and then happens again can be a symptom of a very serious condition. Also, pain is not a symptom in the initial stages of many urinary cancers. Follow these instructions at home:  Drink lots of fluid, 3-4 quarts a day. If you have been diagnosed with an infection, cranberry juice is especially recommended, in addition to large amounts of water.  Avoid caffeine, tea, and carbonated beverages because they tend to irritate the bladder.  Avoid alcohol because it may irritate the prostate.  Take all medicines as directed by your health  care provider.  If you were prescribed an antibiotic medicine, finish it all even if you start to feel better.  If you have been diagnosed with a kidney stone, follow your health care provider's instructions regarding straining your urine to catch the stone.  Empty your bladder often. Avoid holding urine for long periods of time.  After a bowel movement, women should cleanse front to back. Use each tissue only once.  Empty your bladder before and after sexual intercourse if you are a female. Contact a health care provider if:  You develop back pain.  You have a fever.  You have a feeling of sickness in your stomach (nausea) or vomiting.  Your symptoms are not better in 3 days. Return sooner if you are getting worse. Get help right away if:  You develop severe vomiting and are unable to keep the medicine down.  You develop severe back or abdominal pain despite taking your medicines.  You begin passing a large amount of blood or clots in your urine.  You feel extremely weak or faint, or you pass out. This information is not intended to replace advice given to you by your health care provider. Make sure you discuss any questions you have with your health care provider. Document Released: 09/14/2005 Document Revised: 02/20/2016 Document Reviewed: 05/15/2013 Elsevier Interactive Patient Education  2017 Elsevier Inc.  
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
## 2017-06-29 ENCOUNTER — Telehealth: Payer: Self-pay | Admitting: Emergency Medicine

## 2017-06-29 NOTE — Telephone Encounter (Signed)
Patient disability needs an updated form on what she can do now that she is returned to work. I will place the blank form in Dr Latrelle Dodrill box on 06/29/17 please return to the FMLA/Disability box at the [employer] checkout desk within 5-7 business days. Thank you!

## 2017-06-30 NOTE — Telephone Encounter (Signed)
Paperwork scanned and faxed to [facility] on 06/30/17

## 2017-07-06 ENCOUNTER — Encounter: Payer: Self-pay | Admitting: Obstetrics and Gynecology

## 2017-07-06 ENCOUNTER — Ambulatory Visit (INDEPENDENT_AMBULATORY_CARE_PROVIDER_SITE_OTHER): Payer: BLUE CROSS/BLUE SHIELD | Admitting: Obstetrics and Gynecology

## 2017-07-06 VITALS — BP 110/60 | HR 60 | Resp 16 | Wt 187.0 lb

## 2017-07-06 DIAGNOSIS — N920 Excessive and frequent menstruation with regular cycle: Secondary | ICD-10-CM | POA: Diagnosis not present

## 2017-07-06 DIAGNOSIS — Z01812 Encounter for preprocedural laboratory examination: Secondary | ICD-10-CM | POA: Diagnosis not present

## 2017-07-06 DIAGNOSIS — Z862 Personal history of diseases of the blood and blood-forming organs and certain disorders involving the immune mechanism: Secondary | ICD-10-CM

## 2017-07-06 DIAGNOSIS — Z3009 Encounter for other general counseling and advice on contraception: Secondary | ICD-10-CM | POA: Diagnosis not present

## 2017-07-06 DIAGNOSIS — N946 Dysmenorrhea, unspecified: Secondary | ICD-10-CM | POA: Diagnosis not present

## 2017-07-06 DIAGNOSIS — Z113 Encounter for screening for infections with a predominantly sexual mode of transmission: Secondary | ICD-10-CM | POA: Diagnosis not present

## 2017-07-06 LAB — POCT URINE PREGNANCY: Preg Test, Ur: NEGATIVE

## 2017-07-06 NOTE — Patient Instructions (Signed)
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
## 2017-07-07 LAB — CBC
HEMATOCRIT: 31.2 % — AB (ref 34.0–46.6)
Hemoglobin: 9.9 g/dL — ABNORMAL LOW (ref 11.1–15.9)
MCH: 28.4 pg (ref 26.6–33.0)
MCHC: 31.7 g/dL (ref 31.5–35.7)
MCV: 90 fL (ref 79–97)
Platelets: 243 10*3/uL (ref 150–379)
RBC: 3.48 x10E6/uL — ABNORMAL LOW (ref 3.77–5.28)
RDW: 14 % (ref 12.3–15.4)
WBC: 4.5 10*3/uL (ref 3.4–10.8)

## 2017-07-07 LAB — HEP, RPR, HIV PANEL
HIV Screen 4th Generation wRfx: NONREACTIVE
Hepatitis B Surface Ag: NEGATIVE
RPR Ser Ql: NONREACTIVE

## 2017-07-07 LAB — GC/CHLAMYDIA PROBE AMP
CHLAMYDIA, DNA PROBE: NEGATIVE
Neisseria gonorrhoeae by PCR: NEGATIVE

## 2017-07-07 LAB — FERRITIN: Ferritin: 21 ng/mL (ref 15–150)

## 2017-07-08 ENCOUNTER — Telehealth: Payer: Self-pay | Admitting: *Deleted

## 2017-07-08 NOTE — Telephone Encounter (Signed)
-----   Message from Romualdo Bolk, MD sent at 07/08/2017 10:29 AM EDT ----- Please let the patient know that she is anemic again, I would recommend she go on ferrex 150 mg qd if she won't go back to hematology. Her STD testing was negative. Her biopsy is pending.

## 2017-07-08 NOTE — Telephone Encounter (Signed)
Patient returned call to Elaine. °

## 2017-07-08 NOTE — Telephone Encounter (Signed)
Left message to call regarding results -eh 

## 2017-07-12 ENCOUNTER — Telehealth: Payer: Self-pay | Admitting: Obstetrics and Gynecology

## 2017-07-12 NOTE — Telephone Encounter (Signed)
Spoke with patient regarding benefit for Mirena IUD insertion. Patient understood information presented. Patient is agreeable to call at the onset of her cycle for scheduling.   cc: Dr Oscar La

## 2017-07-12 NOTE — Telephone Encounter (Signed)
Spoke with patient and gave results and recommendations.- EH

## 2017-07-14 ENCOUNTER — Encounter: Payer: Self-pay | Admitting: Emergency Medicine

## 2017-07-14 ENCOUNTER — Ambulatory Visit (INDEPENDENT_AMBULATORY_CARE_PROVIDER_SITE_OTHER): Payer: BLUE CROSS/BLUE SHIELD | Admitting: Emergency Medicine

## 2017-07-14 ENCOUNTER — Telehealth: Payer: Self-pay | Admitting: *Deleted

## 2017-07-14 VITALS — BP 110/60 | HR 59 | Temp 98.4°F | Resp 16 | Ht 69.0 in | Wt 182.4 lb

## 2017-07-14 DIAGNOSIS — D500 Iron deficiency anemia secondary to blood loss (chronic): Secondary | ICD-10-CM | POA: Diagnosis not present

## 2017-07-14 DIAGNOSIS — N920 Excessive and frequent menstruation with regular cycle: Secondary | ICD-10-CM | POA: Diagnosis not present

## 2017-07-14 DIAGNOSIS — D649 Anemia, unspecified: Secondary | ICD-10-CM

## 2017-07-14 NOTE — Patient Instructions (Signed)
     IF you received an x-ray today, you will receive an invoice from [facility]. Please contact [facility] at [phone] with questions or concerns regarding your invoice.   IF you received labwork today, you will receive an invoice from [facility]. Please contact [facility] at [phone] with questions or concerns regarding your invoice.   Our billing staff will not be able to assist you with questions regarding bills from these companies.  You will be contacted with the lab results as soon as they are available. The fastest way to get your results is to activate your My Chart account. Instructions are located on the last page of this paperwork. If you have not heard from us regarding the results in 2 weeks, please contact this office.     
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
## 2017-07-14 NOTE — Telephone Encounter (Signed)
Faxed completed and signed progress report ATTN: Selena BattenMeg Cole with Case# [id number]. Confirmation page received at 5:43 pm. Patient was called and advise to pick up faxed copies of forms at check in.

## 2017-07-15 NOTE — Telephone Encounter (Signed)
Paperwork was faxed [facility] about this patient and the reason that she submitted to them that she was taking a leave of absence it was not for her Iron deficiency anemia it was for a "Mental Nervous condition" and has not been at work since 07/02/17.  They are wanting her to be seen by a mental health provider since this is the reason she has listed as why she can not work.   The paperwork that was completed during her visit was completed based on her iron issues, they are not accepting this because it is two different issues.   Also anytime that paperwork is completed during a visit a copy of the forms have to be placed in my FMLA/Disability box so that we have record of them in the chart and there is also a $15 fee for processing this paperwork.  Do you want to complete the forms for her based off her taking leave for a mental health disorder or does the patient need to come back in and be seen again?

## 2017-07-16 NOTE — Telephone Encounter (Signed)
Doesn't need to come back in but needs to be evaluated by a mental health provider. I filled the paperwork based on the information Stephanye gave me and it had nothing to do with mental condition. This paperwork will need to be filled out by the mental health provider. Thanks.

## 2017-07-19 NOTE — Telephone Encounter (Signed)
Pt calling back about paperwork for Insurance

## 2017-07-19 NOTE — Telephone Encounter (Signed)
Previous message stated this form was already faxed to facility and patient was made aware that this was ready for pickup. No letter in box. Left VM.

## 2017-07-20 ENCOUNTER — Telehealth: Payer: Self-pay

## 2017-07-20 NOTE — Telephone Encounter (Signed)
DR Alvy BimlerSAGARDIA NESTOR FROM [employer]  CALLED AND WANTING THE PATIENT DX AND MEDICAL TREATMENT FOR HER TIME OUT OF WORK  PLEASE CALL [phone] OPTION 1

## 2017-07-21 ENCOUNTER — Ambulatory Visit (INDEPENDENT_AMBULATORY_CARE_PROVIDER_SITE_OTHER): Payer: BLUE CROSS/BLUE SHIELD | Admitting: Clinical

## 2017-07-21 DIAGNOSIS — F3173 Bipolar disorder, in partial remission, most recent episode manic: Secondary | ICD-10-CM

## 2017-07-21 NOTE — Telephone Encounter (Signed)
I have forms for this patient and have already spoken to her about them she is waiting on my phone call to come in and get forms once they have been completed.

## 2017-07-21 NOTE — Telephone Encounter (Signed)
Dr Alvy BimlerSagardia does not need to call this number about patients FMLA, this has been handled already. I spoke with patient on Friday and I have spoken with her [employer] coordinator today.

## 2017-07-27 ENCOUNTER — Ambulatory Visit (INDEPENDENT_AMBULATORY_CARE_PROVIDER_SITE_OTHER): Payer: BLUE CROSS/BLUE SHIELD | Admitting: Clinical

## 2017-07-27 ENCOUNTER — Telehealth: Payer: Self-pay | Admitting: Obstetrics and Gynecology

## 2017-07-27 DIAGNOSIS — F3173 Bipolar disorder, in partial remission, most recent episode manic: Secondary | ICD-10-CM

## 2017-07-27 NOTE — Telephone Encounter (Signed)
Patient is calling to schedule her IUD insertion. Patient has started her cycle.

## 2017-07-27 NOTE — Telephone Encounter (Signed)
Spoke with patient. Patient states she started her menses today 07/27/2017 and would like to schedule her IUD insertion. Appointment scheduled for 07/28/2017 at 12:45 pm as patient has a limited schedule. Pre procedure instructions given.  Motrin instructions given. Motrin=Advil=Ibuprofen, 800 mg one hour before appointment. Eat a meal and hydrate well before appointment.  Patient verbalizes understanding.  Routing to provider for final review. Patient agreeable to disposition. Will close encounter.

## 2017-07-28 ENCOUNTER — Encounter: Payer: Self-pay | Admitting: Obstetrics and Gynecology

## 2017-07-28 ENCOUNTER — Ambulatory Visit (INDEPENDENT_AMBULATORY_CARE_PROVIDER_SITE_OTHER): Payer: BLUE CROSS/BLUE SHIELD | Admitting: Obstetrics and Gynecology

## 2017-07-28 VITALS — BP 102/58 | HR 72 | Resp 16 | Wt 184.0 lb

## 2017-07-28 DIAGNOSIS — N920 Excessive and frequent menstruation with regular cycle: Secondary | ICD-10-CM

## 2017-07-28 DIAGNOSIS — N946 Dysmenorrhea, unspecified: Secondary | ICD-10-CM

## 2017-07-28 DIAGNOSIS — Z01812 Encounter for preprocedural laboratory examination: Secondary | ICD-10-CM

## 2017-07-28 DIAGNOSIS — Z30430 Encounter for insertion of intrauterine contraceptive device: Secondary | ICD-10-CM | POA: Diagnosis not present

## 2017-07-28 DIAGNOSIS — Z3009 Encounter for other general counseling and advice on contraception: Secondary | ICD-10-CM

## 2017-07-28 LAB — POCT URINE PREGNANCY: PREG TEST UR: NEGATIVE

## 2017-07-28 NOTE — Patient Instructions (Signed)
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
## 2017-08-02 ENCOUNTER — Ambulatory Visit (INDEPENDENT_AMBULATORY_CARE_PROVIDER_SITE_OTHER): Payer: BLUE CROSS/BLUE SHIELD | Admitting: Clinical

## 2017-08-02 DIAGNOSIS — F3173 Bipolar disorder, in partial remission, most recent episode manic: Secondary | ICD-10-CM

## 2017-08-03 ENCOUNTER — Encounter: Payer: Self-pay | Admitting: Emergency Medicine

## 2017-08-03 ENCOUNTER — Ambulatory Visit (INDEPENDENT_AMBULATORY_CARE_PROVIDER_SITE_OTHER): Payer: BLUE CROSS/BLUE SHIELD | Admitting: Emergency Medicine

## 2017-08-03 VITALS — BP 122/72 | HR 70 | Temp 99.1°F | Resp 17 | Ht 67.0 in | Wt 188.0 lb

## 2017-08-03 DIAGNOSIS — R531 Weakness: Secondary | ICD-10-CM

## 2017-08-03 DIAGNOSIS — D500 Iron deficiency anemia secondary to blood loss (chronic): Secondary | ICD-10-CM | POA: Diagnosis not present

## 2017-08-03 DIAGNOSIS — N920 Excessive and frequent menstruation with regular cycle: Secondary | ICD-10-CM

## 2017-08-03 DIAGNOSIS — D649 Anemia, unspecified: Secondary | ICD-10-CM | POA: Diagnosis not present

## 2017-08-03 NOTE — Patient Instructions (Signed)
     IF you received an x-ray today, you will receive an invoice from [facility]. Please contact [facility] at [phone] with questions or concerns regarding your invoice.   IF you received labwork today, you will receive an invoice from [facility]. Please contact [facility] at [phone] with questions or concerns regarding your invoice.   Our billing staff will not be able to assist you with questions regarding bills from these companies.  You will be contacted with the lab results as soon as they are available. The fastest way to get your results is to activate your My Chart account. Instructions are located on the last page of this paperwork. If you have not heard from us regarding the results in 2 weeks, please contact this office.     
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
## 2017-08-11 ENCOUNTER — Telehealth: Payer: Self-pay | Admitting: Emergency Medicine

## 2017-08-11 NOTE — Telephone Encounter (Signed)
Her restrictions are hours per shift/week, no nights; this has been clearly documented in the past. No physical restrictions.

## 2017-08-11 NOTE — Telephone Encounter (Signed)
Patient stated that her job sent her home because the letter to return to work did not have her restrictions on it. I let her know that I did not think she had any and that she should have been returned to full duty.  Do you want her to have restrictions on her work note if so can we get a new note that I can fax to there employer.

## 2017-08-16 ENCOUNTER — Ambulatory Visit: Payer: BLUE CROSS/BLUE SHIELD | Admitting: Clinical

## 2017-08-23 ENCOUNTER — Ambulatory Visit (INDEPENDENT_AMBULATORY_CARE_PROVIDER_SITE_OTHER): Payer: BLUE CROSS/BLUE SHIELD | Admitting: Clinical

## 2017-08-23 DIAGNOSIS — F3173 Bipolar disorder, in partial remission, most recent episode manic: Secondary | ICD-10-CM

## 2017-08-30 ENCOUNTER — Telehealth: Payer: Self-pay | Admitting: Emergency Medicine

## 2017-08-30 NOTE — Telephone Encounter (Signed)
Incoming call from Dr. Sheral FlowSchloss at [employer]. He is calling to do a peer review for patient's leave of absence. He is requesting Dr. Alvy BimlerSagardia return his call at [phone].

## 2017-08-31 NOTE — Telephone Encounter (Signed)
Copied from CRM [phone]#[id number]. Topic: Inquiry >> Aug 31, 2017  5:06 PM Alexander BergeronBarksdale, Harvey B wrote: Reason for CRM: Dr. Sheral FlowSchloss called from [employer] and had some questions about her care regarding disability, contact [phone]

## 2017-08-31 NOTE — Telephone Encounter (Signed)
Done

## 2017-09-01 NOTE — Telephone Encounter (Signed)
I spoke to her yesterday

## 2017-09-01 NOTE — Telephone Encounter (Signed)
Dr Sheral FlowSchloss would like to [facility] about [facility] disability. His number is [phone]

## 2017-09-01 NOTE — Telephone Encounter (Signed)
I will call his office today and see if I can help him or if he would like to speak with the Provider.

## 2017-09-07 DIAGNOSIS — S0502XA Injury of conjunctiva and corneal abrasion without foreign body, left eye, initial encounter: Secondary | ICD-10-CM | POA: Diagnosis not present

## 2017-09-07 DIAGNOSIS — S0502XD Injury of conjunctiva and corneal abrasion without foreign body, left eye, subsequent encounter: Secondary | ICD-10-CM | POA: Diagnosis not present

## 2017-09-08 DIAGNOSIS — H16403 Unspecified corneal neovascularization, bilateral: Secondary | ICD-10-CM | POA: Diagnosis not present

## 2017-09-08 DIAGNOSIS — H16012 Central corneal ulcer, left eye: Secondary | ICD-10-CM | POA: Diagnosis not present

## 2017-09-10 DIAGNOSIS — H16403 Unspecified corneal neovascularization, bilateral: Secondary | ICD-10-CM | POA: Diagnosis not present

## 2017-09-10 DIAGNOSIS — H16012 Central corneal ulcer, left eye: Secondary | ICD-10-CM | POA: Diagnosis not present

## 2017-09-11 DIAGNOSIS — H16403 Unspecified corneal neovascularization, bilateral: Secondary | ICD-10-CM | POA: Diagnosis not present

## 2017-09-11 DIAGNOSIS — H16012 Central corneal ulcer, left eye: Secondary | ICD-10-CM | POA: Diagnosis not present

## 2017-09-13 DIAGNOSIS — H16403 Unspecified corneal neovascularization, bilateral: Secondary | ICD-10-CM | POA: Diagnosis not present

## 2017-09-13 DIAGNOSIS — H16012 Central corneal ulcer, left eye: Secondary | ICD-10-CM | POA: Diagnosis not present

## 2017-09-14 DIAGNOSIS — H16012 Central corneal ulcer, left eye: Secondary | ICD-10-CM | POA: Diagnosis not present

## 2017-10-08 ENCOUNTER — Other Ambulatory Visit: Payer: Self-pay

## 2017-10-08 ENCOUNTER — Encounter: Payer: Self-pay | Admitting: Emergency Medicine

## 2017-10-08 ENCOUNTER — Ambulatory Visit (INDEPENDENT_AMBULATORY_CARE_PROVIDER_SITE_OTHER): Payer: BLUE CROSS/BLUE SHIELD | Admitting: Emergency Medicine

## 2017-10-08 VITALS — BP 106/60 | HR 71 | Temp 99.1°F | Resp 16 | Wt 188.0 lb

## 2017-10-08 DIAGNOSIS — N920 Excessive and frequent menstruation with regular cycle: Secondary | ICD-10-CM | POA: Diagnosis not present

## 2017-10-08 DIAGNOSIS — D500 Iron deficiency anemia secondary to blood loss (chronic): Secondary | ICD-10-CM

## 2017-10-08 DIAGNOSIS — D649 Anemia, unspecified: Secondary | ICD-10-CM | POA: Diagnosis not present
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
## 2017-10-08 NOTE — Patient Instructions (Signed)
     IF you received an x-ray today, you will receive an invoice from [facility]. Please contact [facility] at [phone] with questions or concerns regarding your invoice.   IF you received labwork today, you will receive an invoice from [facility]. Please contact [facility] at [phone] with questions or concerns regarding your invoice.   Our billing staff will not be able to assist you with questions regarding bills from these companies.  You will be contacted with the lab results as soon as they are available. The fastest way to get your results is to activate your My Chart account. Instructions are located on the last page of this paperwork. If you have not heard from us regarding the results in 2 weeks, please contact this office.     

## 2017-10-20 ENCOUNTER — Ambulatory Visit (HOSPITAL_COMMUNITY): Payer: Self-pay | Admitting: Psychiatry

## 2017-10-26 DIAGNOSIS — F319 Bipolar disorder, unspecified: Secondary | ICD-10-CM | POA: Diagnosis not present

## 2017-11-01 DIAGNOSIS — F319 Bipolar disorder, unspecified: Secondary | ICD-10-CM | POA: Diagnosis not present

## 2017-12-02 ENCOUNTER — Ambulatory Visit (INDEPENDENT_AMBULATORY_CARE_PROVIDER_SITE_OTHER): Payer: BLUE CROSS/BLUE SHIELD | Admitting: Emergency Medicine

## 2017-12-02 ENCOUNTER — Encounter: Payer: Self-pay | Admitting: Emergency Medicine

## 2017-12-02 ENCOUNTER — Other Ambulatory Visit: Payer: Self-pay

## 2017-12-02 VITALS — BP 117/70 | HR 68 | Temp 98.4°F | Resp 18 | Ht 67.0 in | Wt 191.8 lb

## 2017-12-02 DIAGNOSIS — L659 Nonscarring hair loss, unspecified: Secondary | ICD-10-CM

## 2017-12-02 DIAGNOSIS — D649 Anemia, unspecified: Secondary | ICD-10-CM | POA: Diagnosis not present

## 2017-12-02 DIAGNOSIS — R635 Abnormal weight gain: Secondary | ICD-10-CM | POA: Diagnosis not present

## 2017-12-02 DIAGNOSIS — N920 Excessive and frequent menstruation with regular cycle: Secondary | ICD-10-CM | POA: Diagnosis not present

## 2017-12-02 DIAGNOSIS — D500 Iron deficiency anemia secondary to blood loss (chronic): Secondary | ICD-10-CM

## 2017-12-02 LAB — POCT CBC
Granulocyte percent: 63.2 %G (ref 37–80)
HCT, POC: 38.1 % (ref 37.7–47.9)
Hemoglobin: 12.6 g/dL (ref 12.2–16.2)
LYMPH, POC: 1.5 (ref 0.6–3.4)
MCH, POC: 27.2 pg (ref 27–31.2)
MCHC: 33.2 g/dL (ref 31.8–35.4)
MCV: 82 fL (ref 80–97)
MID (CBC): 0.3 (ref 0–0.9)
MPV: 8.3 fL (ref 0–99.8)
PLATELET COUNT, POC: 247 10*3/uL (ref 142–424)
POC Granulocyte: 3 (ref 2–6.9)
POC LYMPH %: 31.4 % (ref 10–50)
POC MID %: 5.4 %M (ref 0–12)
RBC: 4.64 M/uL (ref 4.04–5.48)
RDW, POC: 19.5 %
WBC: 4.8 10*3/uL (ref 4.6–10.2)
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
## 2017-12-02 NOTE — Patient Instructions (Addendum)
   IF you received an x-ray today, you will receive an invoice from [facility]. Please contact [facility] at [phone] with questions or concerns regarding your invoice.   IF you received labwork today, you will receive an invoice from [facility]. Please contact [facility] at [phone] with questions or concerns regarding your invoice.   Our billing staff will not be able to assist you with questions regarding bills from these companies.  You will be contacted with the lab results as soon as they are available. The fastest way to get your results is to activate your My Chart account. Instructions are located on the last page of this paperwork. If you have not heard from us regarding the results in 2 weeks, please contact this office.      Calorie Counting for Weight Loss Calories are units of energy. Your body needs a certain amount of calories from food to keep you going throughout the day. When you eat more calories than your body needs, your body stores the extra calories as fat. When you eat fewer calories than your body needs, your body burns fat to get the energy it needs. Calorie counting means keeping track of how many calories you eat and drink each day. Calorie counting can be helpful if you need to lose weight. If you make sure to eat fewer calories than your body needs, you should lose weight. Ask your health care provider what a healthy weight is for you. For calorie counting to work, you will need to eat the right number of calories in a day in order to lose a healthy amount of weight per week. A dietitian can help you determine how many calories you need in a day and will give you suggestions on how to reach your calorie goal.  A healthy amount of weight to lose per week is usually 1-2 lb (0.5-0.9 kg). This usually means that your daily calorie intake should be reduced by 500-750 calories.  Eating 1,200 - 1,500 calories per day can help most women lose  weight.  Eating 1,500 - 1,800 calories per day can help most men lose weight.  What is my plan? My goal is to have __________ calories per day. If I have this many calories per day, I should lose around __________ pounds per week. What do I need to know about calorie counting? In order to meet your daily calorie goal, you will need to:  Find out how many calories are in each food you would like to eat. Try to do this before you eat.  Decide how much of the food you plan to eat.  Write down what you ate and how many calories it had. Doing this is called keeping a food log.  To successfully lose weight, it is important to balance calorie counting with a healthy lifestyle that includes regular activity. Aim for 150 minutes of moderate exercise (such as walking) or 75 minutes of vigorous exercise (such as running) each week. Where do I find calorie information?  The number of calories in a food can be found on a Nutrition Facts label. If a food does not have a Nutrition Facts label, try to look up the calories online or ask your dietitian for help. Remember that calories are listed per serving. If you choose to have more than one serving of a food, you will have to multiply the calories per serving by the amount of servings you plan to eat. For example, the label on a   package of bread might say that a serving size is 1 slice and that there are 90 calories in a serving. If you eat 1 slice, you will have eaten 90 calories. If you eat 2 slices, you will have eaten 180 calories. How do I keep a food log? Immediately after each meal, record the following information in your food log:  What you ate. Don't forget to include toppings, sauces, and other extras on the food.  How much you ate. This can be measured in cups, ounces, or number of items.  How many calories each food and drink had.  The total number of calories in the meal.  Keep your food log near you, such as in a small notebook in  your pocket, or use a mobile app or website. Some programs will calculate calories for you and show you how many calories you have left for the day to meet your goal. What are some calorie counting tips?  Use your calories on foods and drinks that will fill you up and not leave you hungry: ? Some examples of foods that fill you up are nuts and nut butters, vegetables, lean proteins, and high-fiber foods like whole grains. High-fiber foods are foods with more than 5 g fiber per serving. ? Drinks such as sodas, specialty coffee drinks, alcohol, and juices have a lot of calories, yet do not fill you up.  Eat nutritious foods and avoid empty calories. Empty calories are calories you get from foods or beverages that do not have many vitamins or protein, such as candy, sweets, and soda. It is better to have a nutritious high-calorie food (such as an avocado) than a food with few nutrients (such as a bag of chips).  Know how many calories are in the foods you eat most often. This will help you calculate calorie counts faster.  Pay attention to calories in drinks. Low-calorie drinks include water and unsweetened drinks.  Pay attention to nutrition labels for "low fat" or "fat free" foods. These foods sometimes have the same amount of calories or more calories than the full fat versions. They also often have added sugar, starch, or salt, to make up for flavor that was removed with the fat.  Find a way of tracking calories that works for you. Get creative. Try different apps or programs if writing down calories does not work for you. What are some portion control tips?  Know how many calories are in a serving. This will help you know how many servings of a certain food you can have.  Use a measuring cup to measure serving sizes. You could also try weighing out portions on a kitchen scale. With time, you will be able to estimate serving sizes for some foods.  Take some time to put servings of different  foods on your favorite plates, bowls, and cups so you know what a serving looks like.  Try not to eat straight from a bag or box. Doing this can lead to overeating. Put the amount you would like to eat in a cup or on a plate to make sure you are eating the right portion.  Use smaller plates, glasses, and bowls to prevent overeating.  Try not to multitask (for example, watch TV or use your computer) while eating. If it is time to eat, sit down at a table and enjoy your food. This will help you to know when you are full. It will also help you to be aware of what you   are eating and how much you are eating. What are tips for following this plan? Reading food labels  Check the calorie count compared to the serving size. The serving size may be smaller than what you are used to eating.  Check the source of the calories. Make sure the food you are eating is high in vitamins and protein and low in saturated and trans fats. Shopping  Read nutrition labels while you shop. This will help you make healthy decisions before you decide to purchase your food.  Make a grocery list and stick to it. Cooking  Try to cook your favorite foods in a healthier way. For example, try baking instead of frying.  Use low-fat dairy products. Meal planning  Use more fruits and vegetables. Half of your plate should be fruits and vegetables.  Include lean proteins like poultry and fish. How do I count calories when eating out?  Ask for smaller portion sizes.  Consider sharing an entree and sides instead of getting your own entree.  If you get your own entree, eat only half. Ask for a box at the beginning of your meal and put the rest of your entree in it so you are not tempted to eat it.  If calories are listed on the menu, choose the lower calorie options.  Choose dishes that include vegetables, fruits, whole grains, low-fat dairy products, and lean protein.  Choose items that are boiled, broiled, grilled, or  steamed. Stay away from items that are buttered, battered, fried, or served with cream sauce. Items labeled "crispy" are usually fried, unless stated otherwise.  Choose water, low-fat milk, unsweetened iced tea, or other drinks without added sugar. If you want an alcoholic beverage, choose a lower calorie option such as a glass of wine or light beer.  Ask for dressings, sauces, and syrups on the side. These are usually high in calories, so you should limit the amount you eat.  If you want a salad, choose a garden salad and ask for grilled meats. Avoid extra toppings like bacon, cheese, or fried items. Ask for the dressing on the side, or ask for olive oil and vinegar or lemon to use as dressing.  Estimate how many servings of a food you are given. For example, a serving of cooked rice is  cup or about the size of half a baseball. Knowing serving sizes will help you be aware of how much food you are eating at restaurants. The list below tells you how big or small some common portion sizes are based on everyday objects: ? 1 oz-4 stacked dice. ? 3 oz-1 deck of cards. ? 1 tsp-1 die. ? 1 Tbsp- a ping-pong ball. ? 2 Tbsp-1 ping-pong ball. ?  cup- baseball. ? 1 cup-1 baseball. Summary  Calorie counting means keeping track of how many calories you eat and drink each day. If you eat fewer calories than your body needs, you should lose weight.  A healthy amount of weight to lose per week is usually 1-2 lb (0.5-0.9 kg). This usually means reducing your daily calorie intake by 500-750 calories.  The number of calories in a food can be found on a Nutrition Facts label. If a food does not have a Nutrition Facts label, try to look up the calories online or ask your dietitian for help.  Use your calories on foods and drinks that will fill you up, and not on foods and drinks that will leave you hungry.  Use smaller plates, glasses, and   bowls to prevent overeating. This information is not intended to  replace advice given to you by your health care provider. Make sure you discuss any questions you have with your health care provider. Document Released: 09/14/2005 Document Revised: 08/14/2016 Document Reviewed: 08/14/2016 [employer]vier Interactive Patient Education  2018 [employer]  

## 2017-12-14 DIAGNOSIS — Z0271 Encounter for disability determination: Secondary | ICD-10-CM

## 2017-12-15 ENCOUNTER — Telehealth: Payer: Self-pay | Admitting: Emergency Medicine

## 2017-12-15 NOTE — Telephone Encounter (Signed)
Copied from CRM [id number]. Topic: Inquiry >> Dec 15, 2017  2:44 PM Maia Pettiesrtiz, Kristie S wrote: [employer] called to see if the office received fax sent 12/10/17 to [phone] concerning the pt's long term disability.

## 2017-12-16 NOTE — Telephone Encounter (Signed)
All paperwork and OV notes have [facility] to [facility] on this patient

## 2018-01-05 ENCOUNTER — Encounter: Payer: Self-pay | Admitting: Physician Assistant

## 2018-04-11 DIAGNOSIS — E663 Overweight: Secondary | ICD-10-CM | POA: Diagnosis not present

## 2018-04-11 DIAGNOSIS — K9049 Malabsorption due to intolerance, not elsewhere classified: Secondary | ICD-10-CM | POA: Diagnosis not present

## 2018-04-11 DIAGNOSIS — Z0001 Encounter for general adult medical examination with abnormal findings: Secondary | ICD-10-CM | POA: Diagnosis not present

## 2018-04-11 DIAGNOSIS — Z1383 Encounter for screening for respiratory disorder NEC: Secondary | ICD-10-CM | POA: Diagnosis not present

## 2018-04-11 DIAGNOSIS — Z6828 Body mass index (BMI) 28.0-28.9, adult: Secondary | ICD-10-CM | POA: Diagnosis not present

## 2018-04-11 DIAGNOSIS — M545 Low back pain: Secondary | ICD-10-CM | POA: Diagnosis not present

## 2018-04-11 DIAGNOSIS — E559 Vitamin D deficiency, unspecified: Secondary | ICD-10-CM | POA: Diagnosis not present

## 2018-04-11 DIAGNOSIS — M62830 Muscle spasm of back: Secondary | ICD-10-CM | POA: Diagnosis not present

## 2018-04-25 DIAGNOSIS — E559 Vitamin D deficiency, unspecified: Secondary | ICD-10-CM | POA: Diagnosis not present

## 2018-04-25 DIAGNOSIS — M545 Low back pain: Secondary | ICD-10-CM | POA: Diagnosis not present

## 2018-04-25 DIAGNOSIS — M62830 Muscle spasm of back: Secondary | ICD-10-CM | POA: Diagnosis not present

## 2018-04-25 DIAGNOSIS — Z6828 Body mass index (BMI) 28.0-28.9, adult: Secondary | ICD-10-CM | POA: Diagnosis not present

## 2018-05-02 DIAGNOSIS — S335XXD Sprain of ligaments of lumbar spine, subsequent encounter: Secondary | ICD-10-CM | POA: Diagnosis not present

## 2018-05-02 DIAGNOSIS — M62830 Muscle spasm of back: Secondary | ICD-10-CM | POA: Diagnosis not present

## 2018-05-02 DIAGNOSIS — M545 Low back pain: Secondary | ICD-10-CM | POA: Diagnosis not present

## 2018-05-02 DIAGNOSIS — Z1231 Encounter for screening mammogram for malignant neoplasm of breast: Secondary | ICD-10-CM | POA: Diagnosis not present

## 2018-05-06 DIAGNOSIS — R293 Abnormal posture: Secondary | ICD-10-CM | POA: Diagnosis not present

## 2018-05-06 DIAGNOSIS — S39012D Strain of muscle, fascia and tendon of lower back, subsequent encounter: Secondary | ICD-10-CM | POA: Diagnosis not present

## 2018-05-06 DIAGNOSIS — R531 Weakness: Secondary | ICD-10-CM | POA: Diagnosis not present

## 2018-05-06 DIAGNOSIS — M2560 Stiffness of unspecified joint, not elsewhere classified: Secondary | ICD-10-CM | POA: Diagnosis not present

## 2018-05-06 DIAGNOSIS — M545 Low back pain: Secondary | ICD-10-CM | POA: Diagnosis not present

## 2018-05-10 DIAGNOSIS — S39012D Strain of muscle, fascia and tendon of lower back, subsequent encounter: Secondary | ICD-10-CM | POA: Diagnosis not present

## 2018-05-10 DIAGNOSIS — M2560 Stiffness of unspecified joint, not elsewhere classified: Secondary | ICD-10-CM | POA: Diagnosis not present

## 2018-05-10 DIAGNOSIS — R531 Weakness: Secondary | ICD-10-CM | POA: Diagnosis not present

## 2018-05-10 DIAGNOSIS — M545 Low back pain: Secondary | ICD-10-CM | POA: Diagnosis not present

## 2018-05-10 DIAGNOSIS — R293 Abnormal posture: Secondary | ICD-10-CM | POA: Diagnosis not present

## 2018-05-12 DIAGNOSIS — M2560 Stiffness of unspecified joint, not elsewhere classified: Secondary | ICD-10-CM | POA: Diagnosis not present

## 2018-05-12 DIAGNOSIS — M5127 Other intervertebral disc displacement, lumbosacral region: Secondary | ICD-10-CM | POA: Diagnosis not present

## 2018-05-12 DIAGNOSIS — M545 Low back pain: Secondary | ICD-10-CM | POA: Diagnosis not present

## 2018-05-12 DIAGNOSIS — R293 Abnormal posture: Secondary | ICD-10-CM | POA: Diagnosis not present

## 2018-05-12 DIAGNOSIS — S39012D Strain of muscle, fascia and tendon of lower back, subsequent encounter: Secondary | ICD-10-CM | POA: Diagnosis not present

## 2018-05-12 DIAGNOSIS — R531 Weakness: Secondary | ICD-10-CM | POA: Diagnosis not present

## 2018-05-13 DIAGNOSIS — M2560 Stiffness of unspecified joint, not elsewhere classified: Secondary | ICD-10-CM | POA: Diagnosis not present

## 2018-05-13 DIAGNOSIS — S39012D Strain of muscle, fascia and tendon of lower back, subsequent encounter: Secondary | ICD-10-CM | POA: Diagnosis not present

## 2018-05-13 DIAGNOSIS — R531 Weakness: Secondary | ICD-10-CM | POA: Diagnosis not present

## 2018-05-13 DIAGNOSIS — M545 Low back pain: Secondary | ICD-10-CM | POA: Diagnosis not present

## 2018-05-13 DIAGNOSIS — R293 Abnormal posture: Secondary | ICD-10-CM | POA: Diagnosis not present

## 2018-05-18 DIAGNOSIS — M5416 Radiculopathy, lumbar region: Secondary | ICD-10-CM | POA: Diagnosis not present

## 2018-05-20 DIAGNOSIS — R293 Abnormal posture: Secondary | ICD-10-CM | POA: Diagnosis not present

## 2018-05-20 DIAGNOSIS — R531 Weakness: Secondary | ICD-10-CM | POA: Diagnosis not present

## 2018-05-20 DIAGNOSIS — M2560 Stiffness of unspecified joint, not elsewhere classified: Secondary | ICD-10-CM | POA: Diagnosis not present

## 2018-05-20 DIAGNOSIS — M545 Low back pain: Secondary | ICD-10-CM | POA: Diagnosis not present

## 2018-05-20 DIAGNOSIS — S39012D Strain of muscle, fascia and tendon of lower back, subsequent encounter: Secondary | ICD-10-CM | POA: Diagnosis not present

## 2018-05-23 DIAGNOSIS — M5127 Other intervertebral disc displacement, lumbosacral region: Secondary | ICD-10-CM | POA: Diagnosis not present

## 2018-05-24 DIAGNOSIS — R531 Weakness: Secondary | ICD-10-CM | POA: Diagnosis not present

## 2018-05-24 DIAGNOSIS — R293 Abnormal posture: Secondary | ICD-10-CM | POA: Diagnosis not present

## 2018-05-24 DIAGNOSIS — M2560 Stiffness of unspecified joint, not elsewhere classified: Secondary | ICD-10-CM | POA: Diagnosis not present

## 2018-05-24 DIAGNOSIS — S39012D Strain of muscle, fascia and tendon of lower back, subsequent encounter: Secondary | ICD-10-CM | POA: Diagnosis not present

## 2018-05-24 DIAGNOSIS — M545 Low back pain: Secondary | ICD-10-CM | POA: Diagnosis not present

## 2018-06-03 DIAGNOSIS — R531 Weakness: Secondary | ICD-10-CM | POA: Diagnosis not present

## 2018-06-03 DIAGNOSIS — M2560 Stiffness of unspecified joint, not elsewhere classified: Secondary | ICD-10-CM | POA: Diagnosis not present

## 2018-06-03 DIAGNOSIS — S39012D Strain of muscle, fascia and tendon of lower back, subsequent encounter: Secondary | ICD-10-CM | POA: Diagnosis not present

## 2018-06-03 DIAGNOSIS — R293 Abnormal posture: Secondary | ICD-10-CM | POA: Diagnosis not present

## 2018-06-03 DIAGNOSIS — M545 Low back pain: Secondary | ICD-10-CM | POA: Diagnosis not present

## 2018-06-06 DIAGNOSIS — M5127 Other intervertebral disc displacement, lumbosacral region: Secondary | ICD-10-CM | POA: Diagnosis not present

## 2018-06-07 DIAGNOSIS — M2560 Stiffness of unspecified joint, not elsewhere classified: Secondary | ICD-10-CM | POA: Diagnosis not present

## 2018-06-07 DIAGNOSIS — M545 Low back pain: Secondary | ICD-10-CM | POA: Diagnosis not present

## 2018-06-07 DIAGNOSIS — R531 Weakness: Secondary | ICD-10-CM | POA: Diagnosis not present

## 2018-06-07 DIAGNOSIS — S39012D Strain of muscle, fascia and tendon of lower back, subsequent encounter: Secondary | ICD-10-CM | POA: Diagnosis not present

## 2018-06-07 DIAGNOSIS — R293 Abnormal posture: Secondary | ICD-10-CM | POA: Diagnosis not present

## 2018-06-08 DIAGNOSIS — R293 Abnormal posture: Secondary | ICD-10-CM | POA: Diagnosis not present

## 2018-06-08 DIAGNOSIS — R531 Weakness: Secondary | ICD-10-CM | POA: Diagnosis not present

## 2018-06-08 DIAGNOSIS — S39012D Strain of muscle, fascia and tendon of lower back, subsequent encounter: Secondary | ICD-10-CM | POA: Diagnosis not present

## 2018-06-08 DIAGNOSIS — M2560 Stiffness of unspecified joint, not elsewhere classified: Secondary | ICD-10-CM | POA: Diagnosis not present

## 2018-06-08 DIAGNOSIS — M545 Low back pain: Secondary | ICD-10-CM | POA: Diagnosis not present

## 2018-06-14 DIAGNOSIS — M545 Low back pain: Secondary | ICD-10-CM | POA: Diagnosis not present

## 2018-06-14 DIAGNOSIS — R293 Abnormal posture: Secondary | ICD-10-CM | POA: Diagnosis not present

## 2018-06-14 DIAGNOSIS — R531 Weakness: Secondary | ICD-10-CM | POA: Diagnosis not present

## 2018-06-14 DIAGNOSIS — S39012D Strain of muscle, fascia and tendon of lower back, subsequent encounter: Secondary | ICD-10-CM | POA: Diagnosis not present

## 2018-06-14 DIAGNOSIS — M2560 Stiffness of unspecified joint, not elsewhere classified: Secondary | ICD-10-CM | POA: Diagnosis not present

## 2018-06-16 DIAGNOSIS — S39012D Strain of muscle, fascia and tendon of lower back, subsequent encounter: Secondary | ICD-10-CM | POA: Diagnosis not present

## 2018-06-16 DIAGNOSIS — M545 Low back pain: Secondary | ICD-10-CM | POA: Diagnosis not present

## 2018-06-16 DIAGNOSIS — M2560 Stiffness of unspecified joint, not elsewhere classified: Secondary | ICD-10-CM | POA: Diagnosis not present

## 2018-06-16 DIAGNOSIS — R531 Weakness: Secondary | ICD-10-CM | POA: Diagnosis not present

## 2018-06-16 DIAGNOSIS — R293 Abnormal posture: Secondary | ICD-10-CM | POA: Diagnosis not present

## 2018-06-28 DIAGNOSIS — S39012D Strain of muscle, fascia and tendon of lower back, subsequent encounter: Secondary | ICD-10-CM | POA: Diagnosis not present

## 2018-06-28 DIAGNOSIS — R293 Abnormal posture: Secondary | ICD-10-CM | POA: Diagnosis not present

## 2018-06-28 DIAGNOSIS — R531 Weakness: Secondary | ICD-10-CM | POA: Diagnosis not present

## 2018-06-28 DIAGNOSIS — M545 Low back pain: Secondary | ICD-10-CM | POA: Diagnosis not present

## 2018-06-28 DIAGNOSIS — M2560 Stiffness of unspecified joint, not elsewhere classified: Secondary | ICD-10-CM | POA: Diagnosis not present

## 2018-06-29 DIAGNOSIS — M545 Low back pain: Secondary | ICD-10-CM | POA: Diagnosis not present

## 2018-06-29 DIAGNOSIS — R293 Abnormal posture: Secondary | ICD-10-CM | POA: Diagnosis not present

## 2018-06-29 DIAGNOSIS — R531 Weakness: Secondary | ICD-10-CM | POA: Diagnosis not present

## 2018-06-29 DIAGNOSIS — S39012D Strain of muscle, fascia and tendon of lower back, subsequent encounter: Secondary | ICD-10-CM | POA: Diagnosis not present

## 2018-06-29 DIAGNOSIS — M2560 Stiffness of unspecified joint, not elsewhere classified: Secondary | ICD-10-CM | POA: Diagnosis not present

## 2018-07-04 DIAGNOSIS — M5127 Other intervertebral disc displacement, lumbosacral region: Secondary | ICD-10-CM | POA: Diagnosis not present

## 2018-07-05 DIAGNOSIS — Z23 Encounter for immunization: Secondary | ICD-10-CM | POA: Diagnosis not present

## 2018-07-06 DIAGNOSIS — R531 Weakness: Secondary | ICD-10-CM | POA: Diagnosis not present

## 2018-07-06 DIAGNOSIS — S39012D Strain of muscle, fascia and tendon of lower back, subsequent encounter: Secondary | ICD-10-CM | POA: Diagnosis not present

## 2018-07-06 DIAGNOSIS — M2560 Stiffness of unspecified joint, not elsewhere classified: Secondary | ICD-10-CM | POA: Diagnosis not present

## 2018-07-06 DIAGNOSIS — R293 Abnormal posture: Secondary | ICD-10-CM | POA: Diagnosis not present

## 2018-07-06 DIAGNOSIS — M545 Low back pain: Secondary | ICD-10-CM | POA: Diagnosis not present

## 2018-07-13 DIAGNOSIS — M2560 Stiffness of unspecified joint, not elsewhere classified: Secondary | ICD-10-CM | POA: Diagnosis not present

## 2018-07-13 DIAGNOSIS — R531 Weakness: Secondary | ICD-10-CM | POA: Diagnosis not present

## 2018-07-13 DIAGNOSIS — R293 Abnormal posture: Secondary | ICD-10-CM | POA: Diagnosis not present

## 2018-07-13 DIAGNOSIS — S39012D Strain of muscle, fascia and tendon of lower back, subsequent encounter: Secondary | ICD-10-CM | POA: Diagnosis not present

## 2018-07-13 DIAGNOSIS — M545 Low back pain: Secondary | ICD-10-CM | POA: Diagnosis not present

## 2018-07-19 DIAGNOSIS — R293 Abnormal posture: Secondary | ICD-10-CM | POA: Diagnosis not present

## 2018-07-19 DIAGNOSIS — M2560 Stiffness of unspecified joint, not elsewhere classified: Secondary | ICD-10-CM | POA: Diagnosis not present

## 2018-07-19 DIAGNOSIS — R531 Weakness: Secondary | ICD-10-CM | POA: Diagnosis not present

## 2018-07-19 DIAGNOSIS — S39012D Strain of muscle, fascia and tendon of lower back, subsequent encounter: Secondary | ICD-10-CM | POA: Diagnosis not present

## 2018-07-19 DIAGNOSIS — M545 Low back pain: Secondary | ICD-10-CM | POA: Diagnosis not present

## 2018-08-05 DIAGNOSIS — M5127 Other intervertebral disc displacement, lumbosacral region: Secondary | ICD-10-CM | POA: Diagnosis not present

## 2018-08-10 DIAGNOSIS — R531 Weakness: Secondary | ICD-10-CM | POA: Diagnosis not present

## 2018-08-10 DIAGNOSIS — M545 Low back pain: Secondary | ICD-10-CM | POA: Diagnosis not present

## 2018-08-10 DIAGNOSIS — M2560 Stiffness of unspecified joint, not elsewhere classified: Secondary | ICD-10-CM | POA: Diagnosis not present

## 2018-08-10 DIAGNOSIS — R293 Abnormal posture: Secondary | ICD-10-CM | POA: Diagnosis not present

## 2018-08-10 DIAGNOSIS — S39012D Strain of muscle, fascia and tendon of lower back, subsequent encounter: Secondary | ICD-10-CM | POA: Diagnosis not present

## 2018-08-15 DIAGNOSIS — S39012D Strain of muscle, fascia and tendon of lower back, subsequent encounter: Secondary | ICD-10-CM | POA: Diagnosis not present

## 2018-08-15 DIAGNOSIS — R293 Abnormal posture: Secondary | ICD-10-CM | POA: Diagnosis not present

## 2018-08-15 DIAGNOSIS — R531 Weakness: Secondary | ICD-10-CM | POA: Diagnosis not present

## 2018-08-15 DIAGNOSIS — M2560 Stiffness of unspecified joint, not elsewhere classified: Secondary | ICD-10-CM | POA: Diagnosis not present

## 2018-08-15 DIAGNOSIS — M545 Low back pain: Secondary | ICD-10-CM | POA: Diagnosis not present

## 2018-08-16 DIAGNOSIS — S39012D Strain of muscle, fascia and tendon of lower back, subsequent encounter: Secondary | ICD-10-CM | POA: Diagnosis not present

## 2018-08-16 DIAGNOSIS — R293 Abnormal posture: Secondary | ICD-10-CM | POA: Diagnosis not present

## 2018-08-16 DIAGNOSIS — R531 Weakness: Secondary | ICD-10-CM | POA: Diagnosis not present

## 2018-08-16 DIAGNOSIS — M545 Low back pain: Secondary | ICD-10-CM | POA: Diagnosis not present

## 2018-08-16 DIAGNOSIS — M2560 Stiffness of unspecified joint, not elsewhere classified: Secondary | ICD-10-CM | POA: Diagnosis not present

## 2018-08-17 DIAGNOSIS — R531 Weakness: Secondary | ICD-10-CM | POA: Diagnosis not present

## 2018-08-17 DIAGNOSIS — R293 Abnormal posture: Secondary | ICD-10-CM | POA: Diagnosis not present

## 2018-08-17 DIAGNOSIS — M2560 Stiffness of unspecified joint, not elsewhere classified: Secondary | ICD-10-CM | POA: Diagnosis not present

## 2018-08-17 DIAGNOSIS — M545 Low back pain: Secondary | ICD-10-CM | POA: Diagnosis not present

## 2018-08-17 DIAGNOSIS — S39012D Strain of muscle, fascia and tendon of lower back, subsequent encounter: Secondary | ICD-10-CM | POA: Diagnosis not present

## 2018-08-23 DIAGNOSIS — S39012D Strain of muscle, fascia and tendon of lower back, subsequent encounter: Secondary | ICD-10-CM | POA: Diagnosis not present

## 2018-08-23 DIAGNOSIS — R531 Weakness: Secondary | ICD-10-CM | POA: Diagnosis not present

## 2018-08-23 DIAGNOSIS — M2560 Stiffness of unspecified joint, not elsewhere classified: Secondary | ICD-10-CM | POA: Diagnosis not present

## 2018-08-23 DIAGNOSIS — M545 Low back pain: Secondary | ICD-10-CM | POA: Diagnosis not present

## 2018-08-23 DIAGNOSIS — R293 Abnormal posture: Secondary | ICD-10-CM | POA: Diagnosis not present

## 2018-08-30 DIAGNOSIS — R293 Abnormal posture: Secondary | ICD-10-CM | POA: Diagnosis not present

## 2018-08-30 DIAGNOSIS — M2560 Stiffness of unspecified joint, not elsewhere classified: Secondary | ICD-10-CM | POA: Diagnosis not present

## 2018-08-30 DIAGNOSIS — S39012D Strain of muscle, fascia and tendon of lower back, subsequent encounter: Secondary | ICD-10-CM | POA: Diagnosis not present

## 2018-08-30 DIAGNOSIS — M545 Low back pain: Secondary | ICD-10-CM | POA: Diagnosis not present

## 2018-08-30 DIAGNOSIS — R531 Weakness: Secondary | ICD-10-CM | POA: Diagnosis not present

## 2018-08-31 DIAGNOSIS — M545 Low back pain: Secondary | ICD-10-CM | POA: Diagnosis not present

## 2018-08-31 DIAGNOSIS — R293 Abnormal posture: Secondary | ICD-10-CM | POA: Diagnosis not present

## 2018-08-31 DIAGNOSIS — M2560 Stiffness of unspecified joint, not elsewhere classified: Secondary | ICD-10-CM | POA: Diagnosis not present

## 2018-08-31 DIAGNOSIS — R531 Weakness: Secondary | ICD-10-CM | POA: Diagnosis not present

## 2018-08-31 DIAGNOSIS — S39012D Strain of muscle, fascia and tendon of lower back, subsequent encounter: Secondary | ICD-10-CM | POA: Diagnosis not present

## 2018-09-01 DIAGNOSIS — M5127 Other intervertebral disc displacement, lumbosacral region: Secondary | ICD-10-CM | POA: Diagnosis not present

## 2018-09-05 DIAGNOSIS — M2560 Stiffness of unspecified joint, not elsewhere classified: Secondary | ICD-10-CM | POA: Diagnosis not present

## 2018-09-05 DIAGNOSIS — R531 Weakness: Secondary | ICD-10-CM | POA: Diagnosis not present

## 2018-09-05 DIAGNOSIS — R293 Abnormal posture: Secondary | ICD-10-CM | POA: Diagnosis not present

## 2018-09-05 DIAGNOSIS — S39012D Strain of muscle, fascia and tendon of lower back, subsequent encounter: Secondary | ICD-10-CM | POA: Diagnosis not present

## 2018-09-05 DIAGNOSIS — M545 Low back pain: Secondary | ICD-10-CM | POA: Diagnosis not present

## 2018-09-14 DIAGNOSIS — R531 Weakness: Secondary | ICD-10-CM | POA: Diagnosis not present

## 2018-09-14 DIAGNOSIS — R293 Abnormal posture: Secondary | ICD-10-CM | POA: Diagnosis not present

## 2018-09-14 DIAGNOSIS — M2560 Stiffness of unspecified joint, not elsewhere classified: Secondary | ICD-10-CM | POA: Diagnosis not present

## 2018-09-14 DIAGNOSIS — S39012D Strain of muscle, fascia and tendon of lower back, subsequent encounter: Secondary | ICD-10-CM | POA: Diagnosis not present

## 2018-09-14 DIAGNOSIS — M545 Low back pain: Secondary | ICD-10-CM | POA: Diagnosis not present

## 2018-10-05 DIAGNOSIS — M5126 Other intervertebral disc displacement, lumbar region: Secondary | ICD-10-CM | POA: Diagnosis not present

## 2018-10-05 DIAGNOSIS — M545 Low back pain: Secondary | ICD-10-CM | POA: Diagnosis not present

## 2018-10-05 DIAGNOSIS — M6281 Muscle weakness (generalized): Secondary | ICD-10-CM | POA: Diagnosis not present

## 2018-10-05 DIAGNOSIS — M2560 Stiffness of unspecified joint, not elsewhere classified: Secondary | ICD-10-CM | POA: Diagnosis not present

## 2018-10-10 DIAGNOSIS — M5126 Other intervertebral disc displacement, lumbar region: Secondary | ICD-10-CM | POA: Diagnosis not present

## 2018-10-10 DIAGNOSIS — M2560 Stiffness of unspecified joint, not elsewhere classified: Secondary | ICD-10-CM | POA: Diagnosis not present

## 2018-10-10 DIAGNOSIS — M545 Low back pain: Secondary | ICD-10-CM | POA: Diagnosis not present

## 2018-10-10 DIAGNOSIS — M6281 Muscle weakness (generalized): Secondary | ICD-10-CM | POA: Diagnosis not present

## 2018-10-12 DIAGNOSIS — M545 Low back pain: Secondary | ICD-10-CM | POA: Diagnosis not present

## 2018-10-12 DIAGNOSIS — M5126 Other intervertebral disc displacement, lumbar region: Secondary | ICD-10-CM | POA: Diagnosis not present

## 2018-10-12 DIAGNOSIS — M6281 Muscle weakness (generalized): Secondary | ICD-10-CM | POA: Diagnosis not present

## 2018-10-12 DIAGNOSIS — M2560 Stiffness of unspecified joint, not elsewhere classified: Secondary | ICD-10-CM | POA: Diagnosis not present

## 2018-10-19 DIAGNOSIS — M545 Low back pain: Secondary | ICD-10-CM | POA: Diagnosis not present

## 2018-10-19 DIAGNOSIS — M2560 Stiffness of unspecified joint, not elsewhere classified: Secondary | ICD-10-CM | POA: Diagnosis not present

## 2018-10-19 DIAGNOSIS — M5126 Other intervertebral disc displacement, lumbar region: Secondary | ICD-10-CM | POA: Diagnosis not present

## 2018-10-19 DIAGNOSIS — M6281 Muscle weakness (generalized): Secondary | ICD-10-CM | POA: Diagnosis not present

## 2018-10-31 DIAGNOSIS — M5126 Other intervertebral disc displacement, lumbar region: Secondary | ICD-10-CM | POA: Diagnosis not present

## 2018-10-31 DIAGNOSIS — M545 Low back pain: Secondary | ICD-10-CM | POA: Diagnosis not present

## 2018-10-31 DIAGNOSIS — M6281 Muscle weakness (generalized): Secondary | ICD-10-CM | POA: Diagnosis not present

## 2018-10-31 DIAGNOSIS — M2560 Stiffness of unspecified joint, not elsewhere classified: Secondary | ICD-10-CM | POA: Diagnosis not present

## 2018-11-04 DIAGNOSIS — M2560 Stiffness of unspecified joint, not elsewhere classified: Secondary | ICD-10-CM | POA: Diagnosis not present

## 2018-11-04 DIAGNOSIS — M5126 Other intervertebral disc displacement, lumbar region: Secondary | ICD-10-CM | POA: Diagnosis not present

## 2018-11-04 DIAGNOSIS — M545 Low back pain: Secondary | ICD-10-CM | POA: Diagnosis not present

## 2018-11-04 DIAGNOSIS — M6281 Muscle weakness (generalized): Secondary | ICD-10-CM | POA: Diagnosis not present

## 2018-11-09 DIAGNOSIS — M5126 Other intervertebral disc displacement, lumbar region: Secondary | ICD-10-CM | POA: Diagnosis not present

## 2018-11-09 DIAGNOSIS — M6281 Muscle weakness (generalized): Secondary | ICD-10-CM | POA: Diagnosis not present

## 2018-11-09 DIAGNOSIS — M2560 Stiffness of unspecified joint, not elsewhere classified: Secondary | ICD-10-CM | POA: Diagnosis not present

## 2018-11-09 DIAGNOSIS — M545 Low back pain: Secondary | ICD-10-CM | POA: Diagnosis not present

## 2018-11-14 DIAGNOSIS — M6281 Muscle weakness (generalized): Secondary | ICD-10-CM | POA: Diagnosis not present

## 2018-11-14 DIAGNOSIS — M5126 Other intervertebral disc displacement, lumbar region: Secondary | ICD-10-CM | POA: Diagnosis not present

## 2018-11-14 DIAGNOSIS — M545 Low back pain: Secondary | ICD-10-CM | POA: Diagnosis not present

## 2018-11-14 DIAGNOSIS — M2560 Stiffness of unspecified joint, not elsewhere classified: Secondary | ICD-10-CM | POA: Diagnosis not present

## 2018-11-16 DIAGNOSIS — M2560 Stiffness of unspecified joint, not elsewhere classified: Secondary | ICD-10-CM | POA: Diagnosis not present

## 2018-11-16 DIAGNOSIS — M545 Low back pain: Secondary | ICD-10-CM | POA: Diagnosis not present

## 2018-11-16 DIAGNOSIS — M6281 Muscle weakness (generalized): Secondary | ICD-10-CM | POA: Diagnosis not present

## 2018-11-16 DIAGNOSIS — M5126 Other intervertebral disc displacement, lumbar region: Secondary | ICD-10-CM | POA: Diagnosis not present

## 2018-11-21 DIAGNOSIS — M6281 Muscle weakness (generalized): Secondary | ICD-10-CM | POA: Diagnosis not present

## 2018-11-21 DIAGNOSIS — M545 Low back pain: Secondary | ICD-10-CM | POA: Diagnosis not present

## 2018-11-21 DIAGNOSIS — M5126 Other intervertebral disc displacement, lumbar region: Secondary | ICD-10-CM | POA: Diagnosis not present

## 2018-11-21 DIAGNOSIS — M2560 Stiffness of unspecified joint, not elsewhere classified: Secondary | ICD-10-CM | POA: Diagnosis not present

## 2018-11-22 DIAGNOSIS — M5127 Other intervertebral disc displacement, lumbosacral region: Secondary | ICD-10-CM | POA: Diagnosis not present

## 2018-12-01 DIAGNOSIS — M6281 Muscle weakness (generalized): Secondary | ICD-10-CM | POA: Diagnosis not present

## 2018-12-01 DIAGNOSIS — M5126 Other intervertebral disc displacement, lumbar region: Secondary | ICD-10-CM | POA: Diagnosis not present

## 2018-12-01 DIAGNOSIS — M2560 Stiffness of unspecified joint, not elsewhere classified: Secondary | ICD-10-CM | POA: Diagnosis not present

## 2018-12-01 DIAGNOSIS — M545 Low back pain: Secondary | ICD-10-CM | POA: Diagnosis not present

## 2023-06-23 ENCOUNTER — Encounter: Payer: Self-pay | Admitting: Family
